# Patient Record
Sex: Male | Born: 1945 | Race: White | Hispanic: No | Marital: Married | Smoking: Former smoker
Health system: Southern US, Community
[De-identification: ages and names within clinical notes are randomized; demographics above are authoritative.]

## PROBLEM LIST (undated history)

## (undated) DIAGNOSIS — E669 Obesity, unspecified: Secondary | ICD-10-CM

## (undated) DIAGNOSIS — M109 Gout, unspecified: Secondary | ICD-10-CM

## (undated) DIAGNOSIS — IMO0002 Reserved for concepts with insufficient information to code with codable children: Secondary | ICD-10-CM

## (undated) DIAGNOSIS — E7800 Pure hypercholesterolemia, unspecified: Secondary | ICD-10-CM

## (undated) DIAGNOSIS — I259 Chronic ischemic heart disease, unspecified: Secondary | ICD-10-CM

## (undated) DIAGNOSIS — I10 Essential (primary) hypertension: Secondary | ICD-10-CM

## (undated) HISTORY — DX: Gout, unspecified: M10.9

## (undated) HISTORY — DX: Essential (primary) hypertension: I10

## (undated) HISTORY — DX: Reserved for concepts with insufficient information to code with codable children: IMO0002

## (undated) HISTORY — DX: Pure hypercholesterolemia, unspecified: E78.00

## (undated) HISTORY — DX: Obesity, unspecified: E66.9

## (undated) HISTORY — PX: SHOULDER SURGERY: SHX246

## (undated) HISTORY — DX: Chronic ischemic heart disease, unspecified: I25.9

## (undated) HISTORY — PX: CARDIAC CATHETERIZATION: SHX172

---

## 1980-09-20 HISTORY — PX: KNEE ARTHROSCOPY: SUR90

## 1997-09-20 DIAGNOSIS — I259 Chronic ischemic heart disease, unspecified: Secondary | ICD-10-CM

## 1997-09-20 HISTORY — DX: Chronic ischemic heart disease, unspecified: I25.9

## 1998-05-02 ENCOUNTER — Ambulatory Visit (HOSPITAL_COMMUNITY): Admission: RE | Admit: 1998-05-02 | Discharge: 1998-05-03 | Payer: Self-pay | Admitting: Cardiology

## 1998-08-27 ENCOUNTER — Ambulatory Visit (HOSPITAL_COMMUNITY): Admission: RE | Admit: 1998-08-27 | Discharge: 1998-08-27 | Payer: Self-pay | Admitting: Cardiology

## 2005-08-13 ENCOUNTER — Encounter: Admission: RE | Admit: 2005-08-13 | Discharge: 2005-08-13 | Payer: Self-pay | Admitting: Neurosurgery

## 2005-08-15 ENCOUNTER — Encounter: Admission: RE | Admit: 2005-08-15 | Discharge: 2005-08-15 | Payer: Self-pay | Admitting: Neurosurgery

## 2006-02-22 ENCOUNTER — Inpatient Hospital Stay (HOSPITAL_BASED_OUTPATIENT_CLINIC_OR_DEPARTMENT_OTHER): Admission: RE | Admit: 2006-02-22 | Discharge: 2006-02-22 | Payer: Self-pay | Admitting: Cardiology

## 2007-01-06 ENCOUNTER — Encounter: Admission: RE | Admit: 2007-01-06 | Discharge: 2007-01-06 | Payer: Self-pay | Admitting: Orthopaedic Surgery

## 2009-07-02 ENCOUNTER — Encounter: Admission: RE | Admit: 2009-07-02 | Discharge: 2009-07-02 | Payer: Self-pay | Admitting: Orthopedic Surgery

## 2010-07-24 ENCOUNTER — Ambulatory Visit: Payer: Self-pay | Admitting: Cardiology

## 2010-07-24 ENCOUNTER — Encounter: Admission: RE | Admit: 2010-07-24 | Discharge: 2010-07-24 | Payer: Self-pay | Admitting: Cardiology

## 2010-07-28 ENCOUNTER — Ambulatory Visit (HOSPITAL_COMMUNITY): Admission: RE | Admit: 2010-07-28 | Discharge: 2010-07-28 | Payer: Self-pay | Admitting: Cardiology

## 2010-07-28 ENCOUNTER — Ambulatory Visit: Payer: Self-pay | Admitting: Cardiology

## 2010-08-09 ENCOUNTER — Ambulatory Visit: Payer: Self-pay | Admitting: Internal Medicine

## 2010-08-09 ENCOUNTER — Inpatient Hospital Stay (HOSPITAL_COMMUNITY)
Admission: EM | Admit: 2010-08-09 | Discharge: 2010-08-14 | Payer: Self-pay | Source: Home / Self Care | Admitting: Emergency Medicine

## 2010-08-09 ENCOUNTER — Ambulatory Visit: Payer: Self-pay | Admitting: Cardiology

## 2010-08-10 ENCOUNTER — Encounter: Payer: Self-pay | Admitting: Thoracic Surgery (Cardiothoracic Vascular Surgery)

## 2010-08-10 ENCOUNTER — Ambulatory Visit: Payer: Self-pay | Admitting: Thoracic Surgery (Cardiothoracic Vascular Surgery)

## 2010-08-11 HISTORY — PX: CORONARY ARTERY BYPASS GRAFT: SHX141

## 2010-08-28 ENCOUNTER — Ambulatory Visit: Payer: Self-pay | Admitting: Cardiovascular Disease

## 2010-08-31 ENCOUNTER — Ambulatory Visit: Payer: Self-pay | Admitting: Thoracic Surgery (Cardiothoracic Vascular Surgery)

## 2010-09-07 ENCOUNTER — Ambulatory Visit: Payer: Self-pay | Admitting: Thoracic Surgery (Cardiothoracic Vascular Surgery)

## 2010-09-07 ENCOUNTER — Encounter
Admission: RE | Admit: 2010-09-07 | Discharge: 2010-09-07 | Payer: Self-pay | Source: Home / Self Care | Attending: Thoracic Surgery (Cardiothoracic Vascular Surgery) | Admitting: Thoracic Surgery (Cardiothoracic Vascular Surgery)

## 2010-09-22 ENCOUNTER — Ambulatory Visit
Admission: RE | Admit: 2010-09-22 | Discharge: 2010-09-22 | Payer: Self-pay | Source: Home / Self Care | Attending: Thoracic Surgery (Cardiothoracic Vascular Surgery) | Admitting: Thoracic Surgery (Cardiothoracic Vascular Surgery)

## 2010-09-29 ENCOUNTER — Ambulatory Visit: Payer: Self-pay | Admitting: Cardiology

## 2010-10-15 NOTE — Discharge Summary (Addendum)
NAMEJONTAVIUS, Glass NO.:  [id number]  MEDICAL RECORD NO.:  [id number]          PATIENT TYPE:  INP  LOCATION:  2018                         FACILITY:  [facility]  PHYSICIAN:  Salvatore Decent. Dorris Fetch, M.D.DATE OF BIRTH:  05/12/1946  DATE OF ADMISSION:  08/09/2010 DATE OF DISCHARGE:  08/14/2010                              DISCHARGE SUMMARY   FINAL DIAGNOSIS:  Severe three-vessel coronary artery disease with unstable angina.  IN-HOSPITAL DIAGNOSIS:  Acute blood loss anemia postoperatively.  SECONDARY DIAGNOSES: 1. History of coronary artery disease status post percutaneous     transluminal coronary angioplasty and stenting of left anterior     descending coronary artery, diagonal, and right coronary artery in     1999. 2. Hypertension. 3. Dyslipidemia. 4. Arthritis. 5. Hypothyroidism. 6. History of gastroesophageal reflux disease.  IN-HOSPITAL OPERATIONS AND PROCEDURES:  Coronary artery bypass graftingx4 using a left internal mammary artery to left anterior descending, saphenous vein graft to first diagonal, saphenous vein graft to obtuse marginal one, saphenous vein graft to posterior descending.  Endoscopic vein harvest from right leg was used.  PATIENT'S HISTORY AND PHYSICAL AND HOSPITAL COURSE:  Mr. Michael Glass is a 65- year-old gentleman with a history of coronary artery disease dating back to 1999 when he had multiple stents placed.  He states over the past year, he has been having increasing fatigue and decreased exercise tolerance.  Patient began having chest pain several months ago and on July 28, 2010, underwent cardiac catheterization which showed disease in the distal left main and proximal LAD as well as diffuse disease in the right coronary.  He also had diffuse 50% narrowing in the left circumflex.  The patient had normal left ventricular function.  An attempt was made to manage the patient with medical therapy, however, he began having increasing  severity and frequency of chest pain.  On August 09, 2010, he had an episode while he was watching television of severe left-sided chest pain and radiation in left arm associated with diaphoresis and shortness of breath.  Initial cardiac enzymes were negative and he did rule out for myocardial infarction.  Following admission, Dr. Dorris Fetch was consulted for evaluation for possible coronary artery bypass grafting.  Dr. Dorris Fetch discussed with patient undergoing coronary bypass grafting.  Discussed risks and benefits with patient.  The patient acknowledged understanding and agreed to proceed. Surgery was scheduled for August 11, 2010.  Preoperatively, patient had bilateral carotid duplex ultrasound showing no significant ICA stenosis.  He also had preoperative ABIs which were within normal limits.  For further details of patient's past medical history and physical exam, please see dictated H and P.  The patient was taken to the operating room on August 11, 2010, where he underwent coronary artery bypass grafting x4 using  left internal mammary artery to left anterior descending, saphenous vein graft to first diagonal, saphenous vein graft to obtuse marginal one, saphenous vein graft to posterior descending.  Endoscopic vein harvest from right leg was done.  The patient tolerated this procedure well and was transferred to the intensive care unit in stable condition. Postoperatively, patient was noted to  be hemodynamically stable.  He was extubating of surgery.  Post extubation, the patient was noted to be alert and oriented x4.  Neuro intact.  Postop day #1, patient was noted to be in normal sinus rhythm.  Blood pressure was well controlled.  All drips were able to be weaned and discontinued.  He was restarted on his atenolol.  Chest x-ray obtained postop day #1 was stable.  There is minimal drainage from chest tubes and chest tubes were discontinued in routine fashion.  The  patient was up ambulating with assistance.  He is felt to be stable and transferred out to PCTU postop day #1.  On telemetry floor, the patient continued to progress well.  Followup chest x-ray postop day #2, remained stable with small pleural effusions and mild atelectasis noted.  Blood pressures noted to be slightly elevated and atenolol was adjusted.  The patient was maintained in normal sinus rhythm.  With adjustment of atenolol, his blood pressure has improved. At this time, blood pressure is well controlled and patient was not been started on an ACE inhibitor.  This can be reevaluated as an outpatient as needed.  Patient had been encouraged to use his incentive spirometer. He has been able to be weaned off oxygen with O2 saturations maintaining greater than 90% on room air.  The patient did have some mild acute blood loss anemia and hemoglobin/hematocrit was followed.  This did remain stable.  The patient did not require any blood transfusions postoperatively.  Patient continued to ambulate well.  He was up ambulating on his own.  He was tolerating diet well.  No nausea, vomiting noted.  All incisions were clean, dry, and intact and healing well.  On August 14, 2010, postop day #3, the patient was noted to be afebrile in normal sinus rhythm.  Blood pressure well controlled with O2 sats greater than 90% on room air.  Chest x-ray showed persistent small bilateral pleural effusions.  His most recent lab work shows white blood cell count of 11.6, hemoglobin 10.2, hematocrit 31.0, platelet count 216.  Sodium of 133, potassium 4.5, chloride of 101, bicarbonate 20, BUN 28, creatinine 1.34, glucose 115.  The patient is felt to be stable and ready for discharge to home today postop day #3.  He was seen and evaluated by Dr. Donata Clay on morning rounds.  FOLLOWUP APPOINTMENTS:  A followup appointment will be arranged with Dr. Dorris Fetch in 3 weeks.  Our office will contact patient with  this information.  The patient will need to obtain PMI chest x-ray, 30 minutes prior to this appointment.  He will need to follow up with Dr. Deborah Chalk in 2 weeks.  He will need to contact the office to make these arrangements.  ACTIVITY:  Patient instructed no driving until released to do so, no heavy lifting over 10 pounds.  He is told to ambulate 3-4 times per day, progress as tolerated, and continue his breathing exercises.  INCISIONAL CARE:  The patient is told to shower, washing his incisions using soap and water.  He is to contact the office if he develops any drainage opening from any of his incision sites.  DIET:  The patient was educated on diet, to be low-fat and low-salt.  DISCHARGE MEDICATIONS: 1. Oxycodone 5 mg 1-1 tabs q.3 hours p.r.n. pain. 2. Aspirin 325 mg daily. 3. Atenolol 50 mg daily. 4. Artificial tears one drop both eyes p.r.n. 5. Crestor 5 mg daily. 6. Levothyroxine 80 mcg daily. 7. Multivitamin daily. 8. Nexium  40 mg p.r.n. 9. TriCor 145 mg daily. 10.Ambien 10 mg at night.     Sol Blazing, PA   ______________________________ Salvatore Decent Dorris Fetch, M.D.    KMD/MEDQ  D:  08/14/2010  T:  08/14/2010  Job:  [id number]  cc:   Salvatore Decent. Dorris Fetch, M.D. Colleen Can. Deborah Chalk, M.D. Electronically Signed by Cameron Proud PA on 09/29/2010 12:35:51 PM Electronically Signed by Charlett Lango M.D. on 10/15/2010 11:28:15 AM

## 2010-11-23 ENCOUNTER — Ambulatory Visit (INDEPENDENT_AMBULATORY_CARE_PROVIDER_SITE_OTHER): Payer: Managed Care, Other (non HMO) | Admitting: Nurse Practitioner

## 2010-11-23 DIAGNOSIS — I2510 Atherosclerotic heart disease of native coronary artery without angina pectoris: Secondary | ICD-10-CM

## 2010-11-23 DIAGNOSIS — I10 Essential (primary) hypertension: Secondary | ICD-10-CM

## 2010-12-01 LAB — PROTIME-INR
INR: 1 (ref 0.00–1.49)
INR: 1.44 (ref 0.00–1.49)
Prothrombin Time: 13.4 seconds (ref 11.6–15.2)
Prothrombin Time: 17.7 seconds — ABNORMAL HIGH (ref 11.6–15.2)

## 2010-12-01 LAB — LIPID PANEL
Cholesterol: 147 mg/dL (ref 0–200)
HDL: 37 mg/dL — ABNORMAL LOW (ref >39)
LDL Cholesterol: 92 mg/dL (ref 0–99)
Total CHOL/HDL Ratio: 4 RATIO

## 2010-12-01 LAB — POCT CARDIAC MARKERS
CKMB, poc: <1 ng/mL — ABNORMAL LOW (ref 1.0–8.0)
Myoglobin, poc: 54.2 ng/mL (ref 12–200)
Troponin i, poc: <0.05 ng/mL (ref 0.00–0.09)

## 2010-12-01 LAB — BASIC METABOLIC PANEL
BUN: 13 mg/dL (ref 6–23)
BUN: 28 mg/dL — ABNORMAL HIGH (ref 6–23)
CO2: 22 mEq/L (ref 19–32)
CO2: 25 mEq/L (ref 19–32)
Calcium: 9.6 mg/dL (ref 8.4–10.5)
Chloride: 101 mEq/L (ref 96–112)
Chloride: 104 mEq/L (ref 96–112)
Creatinine, Ser: 1.31 mg/dL (ref 0.4–1.5)
Creatinine, Ser: 1.34 mg/dL (ref 0.4–1.5)
GFR calc Af Amer: >60 mL/min (ref >60)
Glucose, Bld: 108 mg/dL — ABNORMAL HIGH (ref 70–99)
Glucose, Bld: 130 mg/dL — ABNORMAL HIGH (ref 70–99)
Potassium: 4.3 mEq/L (ref 3.5–5.1)
Sodium: 135 mEq/L (ref 135–145)

## 2010-12-01 LAB — COMPREHENSIVE METABOLIC PANEL
ALT: 25 U/L (ref 0–53)
AST: 29 U/L (ref 0–37)
Albumin: 4.1 g/dL (ref 3.5–5.2)
Alkaline Phosphatase: 45 U/L (ref 39–117)
BUN: 13 mg/dL (ref 6–23)
CO2: 25 mEq/L (ref 19–32)
Calcium: 9.3 mg/dL (ref 8.4–10.5)
Chloride: 103 mEq/L (ref 96–112)
Creatinine, Ser: 1.4 mg/dL (ref 0.4–1.5)
GFR calc Af Amer: >60 mL/min (ref >60)
GFR calc non Af Amer: 51 mL/min — ABNORMAL LOW (ref >60)
Glucose, Bld: 87 mg/dL (ref 70–99)
Potassium: 4.2 mEq/L (ref 3.5–5.1)
Sodium: 136 mEq/L (ref 135–145)
Total Bilirubin: 0.5 mg/dL (ref 0.3–1.2)
Total Protein: 7 g/dL (ref 6.0–8.3)

## 2010-12-01 LAB — CARDIAC PANEL(CRET KIN+CKTOT+MB+TROPI)
CK, MB: 1.7 ng/mL (ref 0.3–4.0)
Relative Index: 1.2 (ref 0.0–2.5)
Relative Index: 1.3 (ref 0.0–2.5)
Total CK: 105 U/L (ref 7–232)
Total CK: 130 U/L (ref 7–232)
Troponin I: <0.01 ng/mL (ref 0.00–0.06)

## 2010-12-01 LAB — CBC
HCT: 30.5 % — ABNORMAL LOW (ref 39.0–52.0)
HCT: 37 % — ABNORMAL LOW (ref 39.0–52.0)
HCT: 37.4 % — ABNORMAL LOW (ref 39.0–52.0)
Hemoglobin: 10 g/dL — ABNORMAL LOW (ref 13.0–17.0)
Hemoglobin: 11 g/dL — ABNORMAL LOW (ref 13.0–17.0)
Hemoglobin: 12.1 g/dL — ABNORMAL LOW (ref 13.0–17.0)
Hemoglobin: 12.4 g/dL — ABNORMAL LOW (ref 13.0–17.0)
MCH: 27.5 pg (ref 26.0–34.0)
MCH: 27.6 pg (ref 26.0–34.0)
MCH: 27.6 pg (ref 26.0–34.0)
MCH: 28 pg (ref 26.0–34.0)
MCH: 28.4 pg (ref 26.0–34.0)
MCHC: 32.4 g/dL (ref 30.0–36.0)
MCHC: 32.8 g/dL (ref 30.0–36.0)
MCHC: 32.9 g/dL (ref 30.0–36.0)
MCHC: 33 g/dL (ref 30.0–36.0)
MCHC: 33.2 g/dL (ref 30.0–36.0)
MCHC: 33.5 g/dL (ref 30.0–36.0)
MCV: 84 fL (ref 78.0–100.0)
MCV: 84.7 fL (ref 78.0–100.0)
MCV: 85 fL (ref 78.0–100.0)
MCV: 85.2 fL (ref 78.0–100.0)
MCV: 85.2 fL (ref 78.0–100.0)
Platelets: 198 10*3/uL (ref 150–400)
Platelets: 216 10*3/uL (ref 150–400)
Platelets: 234 10*3/uL (ref 150–400)
Platelets: 270 10*3/uL (ref 150–400)
Platelets: 270 10*3/uL (ref 150–400)
Platelets: 286 10*3/uL (ref 150–400)
RBC: 3.92 MIL/uL — ABNORMAL LOW (ref 4.22–5.81)
RBC: 4.17 MIL/uL — ABNORMAL LOW (ref 4.22–5.81)
RBC: 4.37 MIL/uL (ref 4.22–5.81)
RBC: 4.39 MIL/uL (ref 4.22–5.81)
RDW: 13.6 % (ref 11.5–15.5)
RDW: 13.8 % (ref 11.5–15.5)
RDW: 13.8 % (ref 11.5–15.5)
RDW: 13.9 % (ref 11.5–15.5)
RDW: 14.2 % (ref 11.5–15.5)
WBC: 11.6 10*3/uL — ABNORMAL HIGH (ref 4.0–10.5)
WBC: 12.4 10*3/uL — ABNORMAL HIGH (ref 4.0–10.5)
WBC: 14.5 10*3/uL — ABNORMAL HIGH (ref 4.0–10.5)
WBC: 16.1 10*3/uL — ABNORMAL HIGH (ref 4.0–10.5)
WBC: 6.5 10*3/uL (ref 4.0–10.5)
WBC: 7.1 10*3/uL (ref 4.0–10.5)

## 2010-12-01 LAB — CREATININE, SERUM
Creatinine, Ser: 1.28 mg/dL (ref 0.4–1.5)
GFR calc Af Amer: 49 mL/min — ABNORMAL LOW (ref >60)
GFR calc Af Amer: >60 mL/min (ref >60)
GFR calc non Af Amer: 41 mL/min — ABNORMAL LOW (ref >60)
GFR calc non Af Amer: 57 mL/min — ABNORMAL LOW (ref >60)

## 2010-12-01 LAB — CK TOTAL AND CKMB (NOT AT ARMC)
CK, MB: 1.4 ng/mL (ref 0.3–4.0)
Relative Index: 1.3 (ref 0.0–2.5)
Total CK: 107 U/L (ref 7–232)

## 2010-12-01 LAB — GLUCOSE, CAPILLARY
Glucose-Capillary: 142 mg/dL — ABNORMAL HIGH (ref 70–99)
Glucose-Capillary: 89 mg/dL (ref 70–99)
Glucose-Capillary: 95 mg/dL (ref 70–99)

## 2010-12-01 LAB — BLOOD GAS, ARTERIAL
Acid-Base Excess: 0.9 mmol/L (ref 0.0–2.0)
Drawn by: 33247
O2 Saturation: 97.4 %
Patient temperature: 98
TCO2: 25.7 mmol/L (ref 0–100)
pCO2 arterial: 35.9 mmHg (ref 35.0–45.0)

## 2010-12-01 LAB — HEMOGLOBIN AND HEMATOCRIT, BLOOD: HCT: 27.7 % — ABNORMAL LOW (ref 39.0–52.0)

## 2010-12-01 LAB — TSH: TSH: 6.962 u[IU]/mL — ABNORMAL HIGH (ref 0.350–4.500)

## 2010-12-01 LAB — POCT I-STAT, CHEM 8
BUN: 16 mg/dL (ref 6–23)
Calcium, Ion: 1.15 mmol/L (ref 1.12–1.32)
Chloride: 105 mEq/L (ref 96–112)
Creatinine, Ser: 1.3 mg/dL (ref 0.4–1.5)
Creatinine, Ser: 1.4 mg/dL (ref 0.4–1.5)
Glucose, Bld: 119 mg/dL — ABNORMAL HIGH (ref 70–99)
Glucose, Bld: 165 mg/dL — ABNORMAL HIGH (ref 70–99)
HCT: 40 % (ref 39.0–52.0)
Hemoglobin: 11.9 g/dL — ABNORMAL LOW (ref 13.0–17.0)
Hemoglobin: 13.6 g/dL (ref 13.0–17.0)
Potassium: 3.7 mEq/L (ref 3.5–5.1)
Sodium: 137 mEq/L (ref 135–145)
Sodium: 138 mEq/L (ref 135–145)
TCO2: 19 mmol/L (ref 0–100)
TCO2: 26 mmol/L (ref 0–100)

## 2010-12-01 LAB — HEPARIN LEVEL (UNFRACTIONATED): Heparin Unfractionated: <0.1 IU/mL — ABNORMAL LOW (ref 0.30–0.70)

## 2010-12-01 LAB — POCT I-STAT GLUCOSE
Glucose, Bld: 111 mg/dL — ABNORMAL HIGH (ref 70–99)
Operator id: 286331

## 2010-12-01 LAB — DIFFERENTIAL
Basophils Absolute: 0.1 10*3/uL (ref 0.0–0.1)
Basophils Relative: 1 % (ref 0–1)
Eosinophils Absolute: 0.3 10*3/uL (ref 0.0–0.7)
Eosinophils Relative: 4 % (ref 0–5)
Lymphocytes Relative: 31 % (ref 12–46)
Lymphs Abs: 2.2 10*3/uL (ref 0.7–4.0)
Monocytes Absolute: 0.6 10*3/uL (ref 0.1–1.0)
Monocytes Relative: 9 % (ref 3–12)
Neutro Abs: 3.9 10*3/uL (ref 1.7–7.7)
Neutrophils Relative %: 55 % (ref 43–77)

## 2010-12-01 LAB — POCT I-STAT 3, ART BLOOD GAS (G3+)
Acid-base deficit: 4 mmol/L — ABNORMAL HIGH (ref 0.0–2.0)
Acid-base deficit: 4 mmol/L — ABNORMAL HIGH (ref 0.0–2.0)
Bicarbonate: 21.1 mEq/L (ref 20.0–24.0)
Bicarbonate: 23.9 mEq/L (ref 20.0–24.0)
O2 Saturation: 100 %
O2 Saturation: 99 %
TCO2: 22 mmol/L (ref 0–100)
TCO2: 25 mmol/L (ref 0–100)
pCO2 arterial: 37.7 mmHg (ref 35.0–45.0)
pCO2 arterial: 40.4 mmHg (ref 35.0–45.0)
pCO2 arterial: 47.1 mmHg — ABNORMAL HIGH (ref 35.0–45.0)
pH, Arterial: 7.314 — ABNORMAL LOW (ref 7.350–7.450)
pH, Arterial: 7.328 — ABNORMAL LOW (ref 7.350–7.450)
pO2, Arterial: 131 mmHg — ABNORMAL HIGH (ref 80.0–100.0)
pO2, Arterial: 144 mmHg — ABNORMAL HIGH (ref 80.0–100.0)

## 2010-12-01 LAB — URINALYSIS, MICROSCOPIC ONLY
Hgb urine dipstick: NEGATIVE
Leukocytes, UA: NEGATIVE
Specific Gravity, Urine: 1.015 (ref 1.005–1.030)
Urobilinogen, UA: 0.2 mg/dL (ref 0.0–1.0)

## 2010-12-01 LAB — POCT I-STAT 4, (NA,K, GLUC, HGB,HCT)
Glucose, Bld: 112 mg/dL — ABNORMAL HIGH (ref 70–99)
Glucose, Bld: 145 mg/dL — ABNORMAL HIGH (ref 70–99)
HCT: 27 % — ABNORMAL LOW (ref 39.0–52.0)
Hemoglobin: 10.5 g/dL — ABNORMAL LOW (ref 13.0–17.0)
Hemoglobin: 13.3 g/dL (ref 13.0–17.0)
Hemoglobin: 9.2 g/dL — ABNORMAL LOW (ref 13.0–17.0)
Potassium: 3.8 mEq/L (ref 3.5–5.1)
Potassium: 4.2 mEq/L (ref 3.5–5.1)
Potassium: 4.7 mEq/L (ref 3.5–5.1)
Sodium: 132 mEq/L — ABNORMAL LOW (ref 135–145)
Sodium: 133 mEq/L — ABNORMAL LOW (ref 135–145)
Sodium: 138 mEq/L (ref 135–145)

## 2010-12-01 LAB — MRSA PCR SCREENING: MRSA by PCR: NEGATIVE

## 2010-12-01 LAB — TROPONIN I: Troponin I: 0.03 ng/mL (ref 0.00–0.06)

## 2010-12-01 LAB — TYPE AND SCREEN

## 2010-12-01 LAB — MAGNESIUM
Magnesium: 1.8 mg/dL (ref 1.5–2.5)
Magnesium: 2.4 mg/dL (ref 1.5–2.5)

## 2010-12-01 LAB — APTT: aPTT: 32 seconds (ref 24–37)

## 2010-12-01 LAB — PLATELET COUNT: Platelets: 203 10*3/uL (ref 150–400)

## 2011-01-09 ENCOUNTER — Encounter: Payer: Self-pay | Admitting: Cardiology

## 2011-01-09 DIAGNOSIS — H9190 Unspecified hearing loss, unspecified ear: Secondary | ICD-10-CM | POA: Insufficient documentation

## 2011-01-09 DIAGNOSIS — E669 Obesity, unspecified: Secondary | ICD-10-CM | POA: Insufficient documentation

## 2011-01-09 DIAGNOSIS — IMO0002 Reserved for concepts with insufficient information to code with codable children: Secondary | ICD-10-CM | POA: Insufficient documentation

## 2011-01-09 DIAGNOSIS — E7800 Pure hypercholesterolemia, unspecified: Secondary | ICD-10-CM | POA: Insufficient documentation

## 2011-01-09 DIAGNOSIS — I10 Essential (primary) hypertension: Secondary | ICD-10-CM | POA: Insufficient documentation

## 2011-01-09 DIAGNOSIS — I259 Chronic ischemic heart disease, unspecified: Secondary | ICD-10-CM | POA: Insufficient documentation

## 2011-01-09 DIAGNOSIS — M109 Gout, unspecified: Secondary | ICD-10-CM | POA: Insufficient documentation

## 2011-01-18 ENCOUNTER — Encounter (INDEPENDENT_AMBULATORY_CARE_PROVIDER_SITE_OTHER): Payer: Managed Care, Other (non HMO)

## 2011-01-18 ENCOUNTER — Ambulatory Visit (INDEPENDENT_AMBULATORY_CARE_PROVIDER_SITE_OTHER): Payer: Managed Care, Other (non HMO) | Admitting: Cardiology

## 2011-01-18 ENCOUNTER — Encounter: Payer: Self-pay | Admitting: Cardiology

## 2011-01-18 DIAGNOSIS — I259 Chronic ischemic heart disease, unspecified: Secondary | ICD-10-CM

## 2011-01-18 DIAGNOSIS — IMO0002 Reserved for concepts with insufficient information to code with codable children: Secondary | ICD-10-CM

## 2011-01-18 DIAGNOSIS — I10 Essential (primary) hypertension: Secondary | ICD-10-CM

## 2011-01-18 NOTE — Progress Notes (Signed)
Subjective:   Michael Glass comes in today for followup visit. He had coronary artery bypass grafting x4 and August 11, 2010 by Dr. Dorris Fetch. He had a LIMA to the LAD, saphenous vein graft to the first diagonal, saphenous vein graft to the obtuse marginal one, and a saphenous vein graft to posterior descending artery. He has some residual collection of fluid or a mass at the vein harvesting site on his right lower thigh. He also has considerable paresthesias related to his harvesting of his left internal mammary artery. Otherwise, he has no other significant cardiovascular complaints and is able to exercise much better than he could before.  He has a history of hypercholesterolemia, obesity, and hypertension with good outpatient control.  Current Outpatient Prescriptions  Medication Sig Dispense Refill  . aspirin 81 MG tablet Take 81 mg by mouth 4 (four) times daily.       Marland Kitchen atenolol (TENORMIN) 25 MG tablet Take 25 mg by mouth daily.        . fenofibrate (TRICOR) 145 MG tablet Take 145 mg by mouth daily.        . Multiple Vitamins-Minerals (MEGA MULTI MEN PO) Take by mouth daily.        . nitroGLYCERIN (NITROSTAT) 0.4 MG SL tablet Place 0.4 mg under the tongue every 5 (five) minutes as needed.        Marland Kitchen omeprazole (PRILOSEC) 20 MG capsule Take 20 mg by mouth as needed.        . rosuvastatin (CRESTOR) 5 MG tablet Take 5 mg by mouth 2 (two) times a week.        Marland Kitchen ZOLPIDEM TARTRATE PO Take by mouth at bedtime.          Allergies  Allergen Reactions  . Sulfa Drugs Cross Reactors     Patient Active Problem List  Diagnoses  . Hypertension  . Seroma  . Hearing loss  . Ischemic heart disease  . Hypercholesterolemia  . Gouty arthritis  . Obesity    History  Smoking status  . Former Smoker  . Quit date: 09/21/1999  Smokeless tobacco  . Not on file    History  Alcohol Use No    Family History  Problem Relation Age of Onset  . Hypertension Mother   . Parkinsonism Father   . Angina  Sister     Review of Systems:  He is having recurrent prostatitis. He has persistent soreness in his chest. He is not having any chest pain, shortness of breath, dizziness, headache, but he does complain of generalized arthritis. The patient denies any heat or cold intolerance.  No weight gain or weight loss.  The patient denies headaches or blurry vision.  There is no cough or sputum production.  The patient denies dizziness.  There is no hematuria or hematochezia.  The patient denies any muscle aches or arthritis.  The patient denies any rash.  The patient denies frequent falling or instability.  There is no history of depression or anxiety.  All other systems were reviewed and are negative.   Physical Exam:   Weight is 2:30. Blood pressure is 118/78 sitting, heart rate is 64  The head is normocephalic and atraumatic.  Pupils are equally round and reactive to light.  Sclerae nonicteric.  Conjunctiva is clear.  Oropharynx is unremarkable.  There's adequate oral airway.  Neck is supple there are no masses.  Thyroid is not enlarged.  There is no lymphadenopathy.  Lungs are clear.  Chest is symmetric.  Heart  shows a regular rate and rhythm.  S1 and S2 are normal.  There is no murmur click or gallop.  Abdomen is soft normal bowel sounds.  There is no organomegaly.  Genital and rectal deferred.  Extremities are without edema. There is a golf ball size mass on his right medial thigh just above the level of the knee. Peripheral pulses are adequate.  Neurologically intact.  Full range of motion.  The patient is not depressed.  Skin is warm and dry.chest surgical wounds have healed nicely but he does have paresthesias left of the sternum. Assessment / Plan:

## 2011-01-19 NOTE — Assessment & Plan Note (Signed)
I'll ask him to see Dr. Dorris Fetch for review at this persistent seroma in his right leg. I tried to be reassuring regarding paresthesias in his left chest.

## 2011-01-19 NOTE — Assessment & Plan Note (Signed)
OFFICE VISIT  Michael Glass, Michael Glass DOB:  03/10/1946                                        January 18, 2011 CHART #:  [id number]  The patient comes in today for check of his right lower extremity EVH site.  He is status post coronary artery bypass grafting in November 2011.  Following his surgery, he did return to the office on 2 occasions for check of his [facility] site, which have a small seroma.  On the first visit, the area was aspirated and about an 18-20 mL of serous fluid was removed.  He returned for follow-up about a week later and another attempt was made to aspirate the area yielding only 2-3 mL of old dark blood.  He has been followed up in January by Dr. Dorris Fetch and at that time the area had decreased in size and was not giving any trouble. He was then released from our care.  He returned this week for recheck with Dr. Deborah Chalk and the area was noted still be present on physical exam.  Dr. Deborah Chalk refer the patient back to our office for reevaluation.  He denies any soreness or tenderness in the area and has had no erythema or drainage.  He states that the area occasionally looks slightly enlarged, but mostly stable since his last visit here in January.  He otherwise has done well status post CABG.  He has return to his normal activities and is progressing well.  PHYSICAL EXAMINATION:  VITAL SIGNS:  Blood pressure is 127/74, pulse is 76, respirations 20, O2 sat 97% on room air.  His sternal incision has healed well.  Heart has a. Regular rate and rhythm without murmurs, rubs or gallops.  Lungs are clear to auscultation.  Lower extremities show no significant edema.  He has approximately 3 x 3 cm area medial to his right lower extremity EVH site, which is firm to palpation and no fluctuance is noted.  There is no erythema or drainage.  The area is nontender.  ASSESSMENT/PLAN:  The patient appears to have a stable area of either old seroma or scar tissue  at the endoscopic vein harvest site.  Dr. Cornelius Moras also evaluated the area and since he is not symptomatic and the area is remaining stable and not enlarging he will not need any further intervention at this point.  He is asked to call our office if he has any pain in the area, the area enlarges or he develops any redness or swelling.  Michael Glass, P.A.  GC/MEDQ  D:  01/18/2011  T:  01/19/2011  Job:  [id number]  cc:   Colleen Can. Deborah Chalk, M.D.

## 2011-01-19 NOTE — Assessment & Plan Note (Signed)
Blood pressure readings are satisfactory

## 2011-02-02 NOTE — Assessment & Plan Note (Signed)
OFFICE VISIT   Michael Glass, Michael Glass  DOB:  09-Jan-1946                                        September 07, 2010  CHART #:  [id number]   HISTORY:  The patient comes in today for 3-week postoperative followup.  He is status post coronary artery bypass grafting x4 on August 11, 2010.  His postoperative course was uneventful and he was able to be  discharged home in good condition on postop day #3.  He was seen back in  the office 1 week ago with swelling of EVH site in the right leg.  He  was found to have a seroma of the right EVH site, out of which  approximately 18 mL of serous fluid was drained.  He reports that since  that time, the swelling has recurred and he is having some tenderness  over the area.  He denies any erythema, drainage, fevers, or chills.  He  saw Dr. Deborah Chalk last week in followup and no changes were made to his  medication regimen.  He reports having had 1 episode since his discharge  home of what sounds like orthostatic hypotension where he became  somewhat lightheaded after rising from a seated position.  He has had no  further episodes since that time and his blood pressures were stable  when he was seen at the cardiologist's office.  Otherwise, he is  increasing his activity daily and his stamina is improving.  His  appetite has been good and he reports very little chest discomfort and  no shortness of breath.   PHYSICAL EXAMINATION:  Vital Signs:  Blood pressure is 110/70, pulse is  62 and regular, respirations 16 and unlabored, O2 sat 96% on room air,  and temperature 97.4.  Chest:  His sternal incision has healed well and  the sternum is stable to palpation.  Heart:  Regular rate and rhythm  without murmurs, rubs, or gallops.  Lungs:  Clear to auscultation.  Extremities:  The right lower extremity EVH site is without erythema or  drainage.  There is some recurrent swelling circumferential to the [facility]  site with mild tenderness to  palpation, but no signs of infection.  The  area was anesthetized with topical spray and using an 18-gauge needle, I  attempted to aspirate the area.  However, only 2-3 mL of old dark blood  was evacuated.  There was some improvement in the swelling following the  aspiration and the patient reported symptomatic improvement.   Chest x-ray today shows almost completely resolved pleural effusions and  otherwise normal postoperative changes.   ASSESSMENT/PLAN:  The patient is overall doing well status post coronary  artery bypass graft.  At this point, he may resume driving and increase  his activity as tolerated.  I would like for him to return in 2 weeks  for recheck of the right [facility] site.  He is encouraged to call if he  develops worsening swelling, pain, drainage, erythema, or signs of  infection from the area.   Coral Ceo, P.A.   GC/MEDQ  D:  09/07/2010  T:  09/08/2010  Job:  [id number]   cc:   Colleen Can. Deborah Chalk, M.D.

## 2011-02-02 NOTE — Assessment & Plan Note (Signed)
OFFICE VISIT   Michael Glass, MEALS  DOB:  [birth year]                                        August 31, 2010  CHART #:  [id number]   HISTORY:  The patient is a 65 year old gentleman who is status post  coronary artery bypass grafting x4 on August 11, 2010, by Dr. Charlett Lango.  He called the office on today's date to report that he was  having significant swelling in the right leg at the endoscopic vein  harvest site incision.  We asked him to come in to get this evaluated.  Currently, he reports in general, he is feeling fairly well.  He does  have some moderate soreness in the chest associated with the incision  and sternotomy.  He denies shortness of breath.  He has had no fevers,  chills, or other constitutional symptoms.  He reports that the swelling  has been present for a while, although it has worsened recently.   PHYSICAL EXAMINATION:  Vital Signs:  Blood pressure 117/75, pulse 62,  respiration 16, oxygen saturation is 100% on room air, and temperature  is 97.3.  General Appearance:  This is a well-developed adult male, in  no acute distress.  Pulmonary:  Clear breath sounds bilaterally.  Cardiac:  Regular rate and rhythm.  Normal S1 and S2.  Incisions are  healing well.  Extremities:  The right lower extremity endoscopic vein  harvest site does reveal some significant swelling consistent with  either hematoma or seroma.   BRIEF PROCEDURE:  After prepping the skin with Betadine, I used a 21-  gauge needle to draw off 18 mL of serous fluid.  This showed initially  significant improvement in the swelling in the area.  It offered some  symptomatic relief as well.   ASSESSMENT:  Right-sided endoscopic vein harvest site seroma status post  drainage.  The patient is scheduled to be seen back in the office in 1  week and we will keep this appointment.  We did apply pressure dressing  and the patient understands that he should contact our office  should he  have any further difficulty relating to this issue specifically if the  swelling returns as previously.  We also discussed management of the  pressure dressing that if he is to have  significant lower extremity swelling or any type of numbness, he is to  remove this dressing and just keep it off.   Rowe Clack, P.A.-C.   Sherryll Burger  D:  08/31/2010  T:  09/01/2010  Job:  [id number]   cc:   Colleen Can. Deborah Chalk, M.D.

## 2011-02-02 NOTE — Assessment & Plan Note (Signed)
OFFICE VISIT   Michael Glass, Michael Glass  DOB:  [birth year]                                        September 22, 2010  CHART #:  [id number]   HISTORY:  The patient is a 65 year old gentleman who presented back in  November with unstable angina.  He had been catheterized earlier in a  month and had moderate left main and significant three-vessel disease,  was initially treated medically but failed medical management.  Given  his unstable symptomatology, coronary bypass grafting was recommended  and he underwent coronary bypass grafting x4 on August 11, 2010.  His  postoperative course was uncomplicated and he was discharged home on  postoperative day #3.  He subsequently was seen back in the office with  swelling around the lower portion of the saphenous venectomy site on the  right leg.  He was diagnosed having seroma and about 20 mL of fluid was  evacuated.  He was then seen back couple weeks ago.  On reaspiration  only had 2-3 mm of fluid present at that time.  He states that since  then it has continued to decrease in size.  He is having minimal  incisional discomfort.  He is still taking a pain pill at night before  he goes to bed but other than that he has not having to take anything.  He has not had any anginal-type symptoms since his surgery.  His only  complaint is that he says when he first stands up or when he first  starts to walk he gets a little dizzy.  If he stands still for about 10-  15 seconds, it will resolve and then he can go ahead and walk without  any difficulties after that.   CURRENT MEDICATIONS:  1. Aspirin 325 mg daily.  2. Atenolol 50 mg daily.  3. Crestor 5 mg daily.  4. Levothyroxine 88 mcg daily.  5. Nexium 40 mg daily.  6. TriCor 145 mg daily.  7. Ambien 10 mg at bedtime.  8. multivitamin one daily.  9. Oxycodone 5 mg p.r.n. pain.   ALLERGIES:  He has no known allergies.   PHYSICAL EXAMINATION:  The patient is a well-appearing  65 year old  gentleman in no acute distress.  His blood pressure is 103/67, pulse 62,  respirations are 16, oxygen saturation is 98% on room air.  His lungs  are clear with equal breath sounds bilaterally.  Cardiac exam has a  regular rate and rhythm.  Normal S1 and S2.  No rubs, murmurs, or  gallops.  His sternum is stable.  Sternal incision is clean, dry, and  intact.  His leg incision has healed well.  There is a small seroma that  is nontender.  There is no erythema or induration.  There is no  peripheral edema.   IMAGING:  Chest x-ray was done back in December which showed no  significant pleural effusions.   IMPRESSION:  The patient is a 65 year old gentleman.  He is now about 6  weeks out from coronary bypass grafting.  He is recovering very well.  His only issue is this dizziness that he gets when he first stands up,  first starts to walk.  His blood pressure and heart rate are both  relatively low and I recommended that he decrease his atenolol to 25 mg  daily, that  was the dose he had been on previously.  I think that may  help his symptoms.  If they persist beyond that, we can reevaluate.  He  otherwise is doing well.  He may begin driving.  He is not to lift any  objects that weigh greater than 20 pounds for another 2 weeks.  Beyond  that, his activities are unrestricted.  He will continue to be followed  by Dr. Humberto Seals at the [facility] as well as Dr. Sherral Hammers in [place]  and Dr. Deborah Chalk here in [place].  I would be happy to see him back  anytime if I can be of any further assistance with his care.   Salvatore Decent Dorris Fetch, M.D.  Electronically Signed   SCH/MEDQ  D:  09/22/2010  T:  09/23/2010  Job:  [id number]   cc:   Colleen Can. Deborah Chalk, M.D.  Humberto Seals, MD  Keturah Barre, MD

## 2011-02-05 NOTE — Cardiovascular Report (Signed)
NAMEKATHAN, Michael Glass NO.:  [id number]   MEDICAL RECORD NO.:  [id number]          PATIENT TYPE:  OIB   LOCATION:  1961                         FACILITY:  [facility]   PHYSICIAN:  Colleen Can. Deborah Chalk, M.D.DATE OF BIRTH:  [birth year]   DATE OF PROCEDURE:  02/22/2006  DATE OF DISCHARGE:                              CARDIAC CATHETERIZATION   PROCEDURE:  Left heart catheterization, selective coronary angiography, left  ventricular angiography.   TYPE AND SITE OF ENTRY:  Percutaneous right femoral artery.   CATHETERS:  4-French 4 curved Judkins right and left coronary catheters, 4-  French pigtail ventriculographic catheter.   CONTRAST MATERIAL:  Omnipaque.   MEDICATIONS GIVEN PRIOR TO THE PROCEDURE:  Valium 10 mg.   MEDICATIONS GIVEN DURING THE PROCEDURE:  Versed 3 mg IV.   COMMENT:  The patient tolerated the procedure well.   HEMODYNAMIC DATA:  The aortic pressure is 120/73, LV is 115 over 7-14.  There is no aortic valve gradient on pullback.   ANGIOGRAPHIC DATA:  1.  The left main coronary artery is normal.  2.  The left circumflex has a high obtuse marginal vessel that is      essentially normal with minor irregularities.  The left circumflex      continues as an obtuse marginal that bifurcates.  In the larger branch      extending to the posterior lateral wall, there is a 70+% stenosis.  It      is a relatively small vessel that would be approximately to 2.5 mm in      diameter.  3.  The left anterior descending is a moderate size vessel that ends on the      anterior wall.  There are stents in the bifurcation point of the largest      diagonal in the continuation of the left anterior descending.  The      stents are widely patent.  In the distal left anterior descending, there      is 50% stenosis approximately 3/4 the way down the vessel.  There is      some haziness at the bifurcation point of the stents, but overall, was      felt to be satisfactory with  only moderate narrowing.  4.  The right coronary artery is a very large dominant vessel with a large      posterior lateral and posterior descending vessels.  There are several      30% stenoses scattered throughout the vessel, but no critical stenosis      is present.  5.  The left ventricular angiogram was performed in the RAO position.      Overall cardiac size and silhouette are normal.  Global ejection      fraction 60%.  Regional wall motion is normal.   OVERALL IMPRESSION:  1.  Normal left ventricular function.  2.  Somewhat diffuse three-vessel coronary disease with 30% stenosis      scattered in the right coronary artery; patent stents in the left      anterior descending with a 30-50% stenosis prior  to the stents and a 50%      stenosis in the distal left anterior descending; 70% stenosis in a third      obtuse marginal branch of the left circumflex at the posterior lateral      wall.   DISCUSSION:  It is felt that the best course of management is a medical  regimen at this point time.  The overall prognosis should be good.  The only  vessel of concern is his posterior lateral branch of the left circumflex  (third obtuse marginal) but it is felt to be a patent enough that we should  be able have success with medical management.      Colleen Can. Deborah Chalk, M.D.  Electronically Signed     SNT/MEDQ  D:  02/22/2006  T:  02/22/2006  Job:  [id number]

## 2011-02-05 NOTE — H&P (Signed)
NAMEPEREGRINE, NOLT NO.:  [id number]   MEDICAL RECORD NO.:  [id number]           PATIENT TYPE:   LOCATION:                                 FACILITY:   PHYSICIAN:  Colleen Can. Deborah Chalk, M.D.    DATE OF BIRTH:   DATE OF ADMISSION:  02/22/2006  DATE OF DISCHARGE:                                HISTORY & PHYSICAL   CHIEF COMPLAINT:  Left arm pain.   HISTORY OF PRESENT ILLNESS:  Michael Glass is a 65 year old white male.  He has a  known history of ischemic heart disease.  Last catheterization dates back to  1999.  He had his last Cardiolite study in November 2006, which was felt to  be satisfactory with no evidence of ischemia.  Ejection fraction at that  time was 68%.  He presents to our office today on February 18, 2006, with  complaints of left arm pain that has been exertional in nature over the past  1-2 weeks.  It occurred originally while he was loading wood.  He was  outside on a very hot day.  He became very diaphoretic yet with no shortness  of breath.  He basically got scared.  He has been noted to have elevation in  blood pressure and has been called in Norvasc 5 mg.  However, he has only  taken it sporadically.  He has continued to try to cut wood and each time  has recurrent left arm pain.  He has taken nitroglycerin with relief and he  now presents for elective cardiac catheterization.   PAST MEDICAL HISTORY:  1.  Known atherosclerotic cerebrovascular disease.  He had catheterization      in August 1999 with stent placement to the LAD, diagonal and right      coronary.  He had follow-up catheterization 4 months later in December      1999, which showed normal LV function, mild three-vessel disease with      persistent patency of the stents in the right coronary, LAD and diagonal      coronary arteries.  2.  Obesity.  3.  Past tobacco abuse.  4.  Hyperlipidemia.  5.  History of elevated TSH.  6.  Remote history of pneumonia.   SURGICAL HISTORY:  1.   Right knee arthroscopy.  2.  Right shoulder surgery.  3.  A T&A.   ALLERGIES:  SULFA.   CURRENT MEDICATIONS:  1.  Tricor 145 mg a day.  2.  Aspirin daily.  3.  Naprosyn p.r.n.  4.  Zetia 10 mg a day.  5.  Norvasc 5 mg a day.  6.  Ambien p.r.n.   FAMILY HISTORY:  Positive for heart disease.  His father has had a history  of angioplasty.  Mother has had a history of hypertension.   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  He is no longer smoking.  He has no alcohol use.  He is married.   REVIEW OF SYSTEMS:  Basically as noted above and is otherwise unremarkable.   PERTINENT  LABORATORY DATA:  Cholesterol levels were checked on May 31.  Unfortunately, he was only 3 hours fasting.  Total cholesterol was 177,  triglycerides 406 and HDL 34, LDL was unable to be calculated.  PT and PTT  are unremarkable.  CBC is normal.  Chemistries are otherwise normal as well.  Chest x-ray is pending.  EKG showed sinus rhythm.   OVERALL IMPRESSION:  1.  Left arm pain, which is his anginal equivalent.  2.  Known ischemic heart disease.  3.  Obesity.  4.  Hyperlipidemia.  5.  Past tobacco abuse.   PROCEDURE:  Will proceed on with diagnostic catheterization.  The procedure  has been discussed in full detail, and he is willing to proceed on Tuesday,  February 22, 2006.      Sharlee Blew, N.P.      Colleen Can. Deborah Chalk, M.D.  Electronically Signed    LC/MEDQ  D:  02/18/2006  T:  02/18/2006  Job:  [id number]

## 2011-06-04 ENCOUNTER — Other Ambulatory Visit: Payer: Self-pay | Admitting: Nurse Practitioner

## 2011-06-04 MED ORDER — ATENOLOL 25 MG PO TABS: 25.0000 mg | ORAL_TABLET | Freq: Every day | ORAL | Status: DC

## 2011-06-04 NOTE — Telephone Encounter (Signed)
Patient wants refill on his altenolol.  He uses [facility] in [place]

## 2011-06-04 NOTE — Telephone Encounter (Signed)
escribe medication per fax request  

## 2011-06-25 ENCOUNTER — Ambulatory Visit (INDEPENDENT_AMBULATORY_CARE_PROVIDER_SITE_OTHER): Payer: Managed Care, Other (non HMO) | Admitting: Nurse Practitioner

## 2011-06-25 ENCOUNTER — Encounter: Payer: Self-pay | Admitting: Nurse Practitioner

## 2011-06-25 DIAGNOSIS — E039 Hypothyroidism, unspecified: Secondary | ICD-10-CM

## 2011-06-25 DIAGNOSIS — E785 Hyperlipidemia, unspecified: Secondary | ICD-10-CM

## 2011-06-25 DIAGNOSIS — E669 Obesity, unspecified: Secondary | ICD-10-CM

## 2011-06-25 DIAGNOSIS — I2510 Atherosclerotic heart disease of native coronary artery without angina pectoris: Secondary | ICD-10-CM

## 2011-06-25 DIAGNOSIS — I10 Essential (primary) hypertension: Secondary | ICD-10-CM

## 2011-06-25 DIAGNOSIS — E7800 Pure hypercholesterolemia, unspecified: Secondary | ICD-10-CM

## 2011-06-25 DIAGNOSIS — I259 Chronic ischemic heart disease, unspecified: Secondary | ICD-10-CM

## 2011-06-25 LAB — BASIC METABOLIC PANEL
BUN: 19 mg/dL (ref 6–23)
CO2: 25 mEq/L (ref 19–32)
Calcium: 9.3 mg/dL (ref 8.4–10.5)
Chloride: 101 mEq/L (ref 96–112)
Creatinine, Ser: 1.4 mg/dL (ref 0.4–1.5)
GFR: 52.57 mL/min — ABNORMAL LOW (ref >60.00)
Glucose, Bld: 103 mg/dL — ABNORMAL HIGH (ref 70–99)
Potassium: 4 mEq/L (ref 3.5–5.1)
Sodium: 136 mEq/L (ref 135–145)

## 2011-06-25 LAB — TSH: TSH: 2.23 u[IU]/mL (ref 0.35–5.50)

## 2011-06-25 LAB — HEPATIC FUNCTION PANEL
ALT: 25 U/L (ref 0–53)
AST: 27 U/L (ref 0–37)
Albumin: 4.8 g/dL (ref 3.5–5.2)
Alkaline Phosphatase: 47 U/L (ref 39–117)
Bilirubin, Direct: 0 mg/dL (ref 0.0–0.3)
Total Bilirubin: 0.8 mg/dL (ref 0.3–1.2)
Total Protein: 8 g/dL (ref 6.0–8.3)

## 2011-06-25 LAB — LIPID PANEL
Cholesterol: 160 mg/dL (ref 0–200)
HDL: 37.9 mg/dL — ABNORMAL LOW (ref >39.00)
LDL Cholesterol: 97 mg/dL (ref 0–99)
Total CHOL/HDL Ratio: 4
Triglycerides: 128 mg/dL (ref 0.0–149.0)
VLDL: 25.6 mg/dL (ref 0.0–40.0)

## 2011-06-25 NOTE — Assessment & Plan Note (Signed)
He is almost one year out from his bypass surgery. He is doing well. Weight is creeping up and I cautioned him about that. He needs to increase his walking and I encouraged him to work towards a goal of 60 minutes per day. We will see him back in 6 months. He will see Dr. Antoine Poche on return. Patient is agreeable to this plan and will call if any problems develop in the interim.

## 2011-06-25 NOTE — Assessment & Plan Note (Signed)
Blood pressure is good at home. No change with his current medicines. Weight loss is encouraged.

## 2011-06-25 NOTE — Patient Instructions (Addendum)
Stay on your current medicines Exercise and stay active. You need to increase your walking. I will have you see Dr. Antoine Poche in 6 months for a follow up visit. We will check labs today.  Call for any problems.

## 2011-06-25 NOTE — Progress Notes (Signed)
Michael Glass Date of Birth: 01/30/1946   History of Present Illness: Michael Glass is seen for a follow up visit today. He is seen for Dr. Antoine Poche. He is a former patient of Dr. Ronnald Nian. He was going to go to the [facility] office, but it has closed. He will be coming here. He is doing ok. No chest pain. No elbow pain. He is walking only about a mile a day. His weight is climbing back up. He is tolerating his medicines. He is fasting today and we will check his labs. Overall, he is pleased with how he feels.   Current Outpatient Prescriptions on File Prior to Visit  Medication Sig Dispense Refill  . aspirin 81 MG tablet Take 81 mg by mouth 4 (four) times daily.       Marland Kitchen atenolol (TENORMIN) 25 MG tablet Take 1 tablet (25 mg total) by mouth daily.  30 tablet  6  . fish oil-omega-3 fatty acids 1000 MG capsule Take 2 g by mouth daily.        Marland Kitchen gabapentin (NEURONTIN) 300 MG capsule Take 600 mg by mouth 3 (three) times daily.        Marland Kitchen levothyroxine (SYNTHROID, LEVOTHROID) 88 MCG tablet Take 88 mcg by mouth daily.        . Multiple Vitamins-Minerals (MEGA MULTI MEN PO) Take by mouth daily.        . nitroGLYCERIN (NITROSTAT) 0.4 MG SL tablet Place 0.4 mg under the tongue every 5 (five) minutes as needed.        . rosuvastatin (CRESTOR) 5 MG tablet Take 5 mg by mouth 2 (two) times a week.        Marland Kitchen ZOLPIDEM TARTRATE PO Take by mouth at bedtime.        Marland Kitchen DISCONTD: fenofibrate (TRICOR) 145 MG tablet Take 145 mg by mouth daily.          Allergies  Allergen Reactions  . Sulfa Drugs Cross Reactors     Past Medical History  Diagnosis Date  . Hypertension   . Seroma     RIGHT LEG  . Hearing loss   . Ischemic heart disease 1999    PREVIOUS STENTS IN THE LAD, AND RIGHT CORONARY ; S/P CABG in November 2011  . Hypercholesterolemia   . Gouty arthritis   . Obesity   . 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 Coronary artery bypass graft 08/11/10     LIMA to LAD, SVG to 1st DX, SVG to OM1 and SVG to PD  . Cardiac catheterization 2007, 2011  . Shoulder surgery     RIGHT SHOULDER  . Knee arthroscopy 1982    RIGHT KNEE    History  Smoking status  . Former Smoker  . Quit date: 09/21/1999  Smokeless tobacco  . Not on file    History  Alcohol Use No    Family History  Problem Relation Age of Onset  . Hypertension Mother   . Parkinsonism Father   . Heart disease Father   . Angina Sister     Review of Systems: The review of systems is positive for some arthritis. No cardiac complaints. Blood pressure at home is good.  He has had a residual seroma from his vein harvesting site that sometimes bothers him. All other systems were reviewed and are negative.  Physical Exam: BP 136/82  Pulse 56  Ht 6' (1.829 m)  Wt 237 lb (107.502 kg)  BMI 32.14 kg/m2 Patient is very pleasant and in no acute distress. Skin is warm and dry. Color is normal.  HEENT is unremarkable. Normocephalic/atraumatic. PERRL. Sclera are nonicteric. Neck is supple. No masses. No JVD. Lungs are clear. Cardiac exam shows a regular rate and rhythm. Sernum looks good. Abdomen is obese but soft. Extremities are without edema. Gait and ROM are intact. No gross neurologic deficits noted.   LABORATORY DATA: PENDING   Assessment / Plan:

## 2011-06-25 NOTE — Assessment & Plan Note (Signed)
Weight loss is encouraged 

## 2011-06-25 NOTE — Assessment & Plan Note (Signed)
Labs are checked today. He is only on Crestor two times a week due to history of myalgias.

## 2011-07-27 IMAGING — CR DG CHEST 1V PORT
1 series · 1 of 1 positions shown · non-contrast
Comparison: Chest x-ray 07/24/2010.

CLINICAL DATA: Chest pain and shortness of breath.

PORTABLE CHEST - 1 VIEW

[AP]
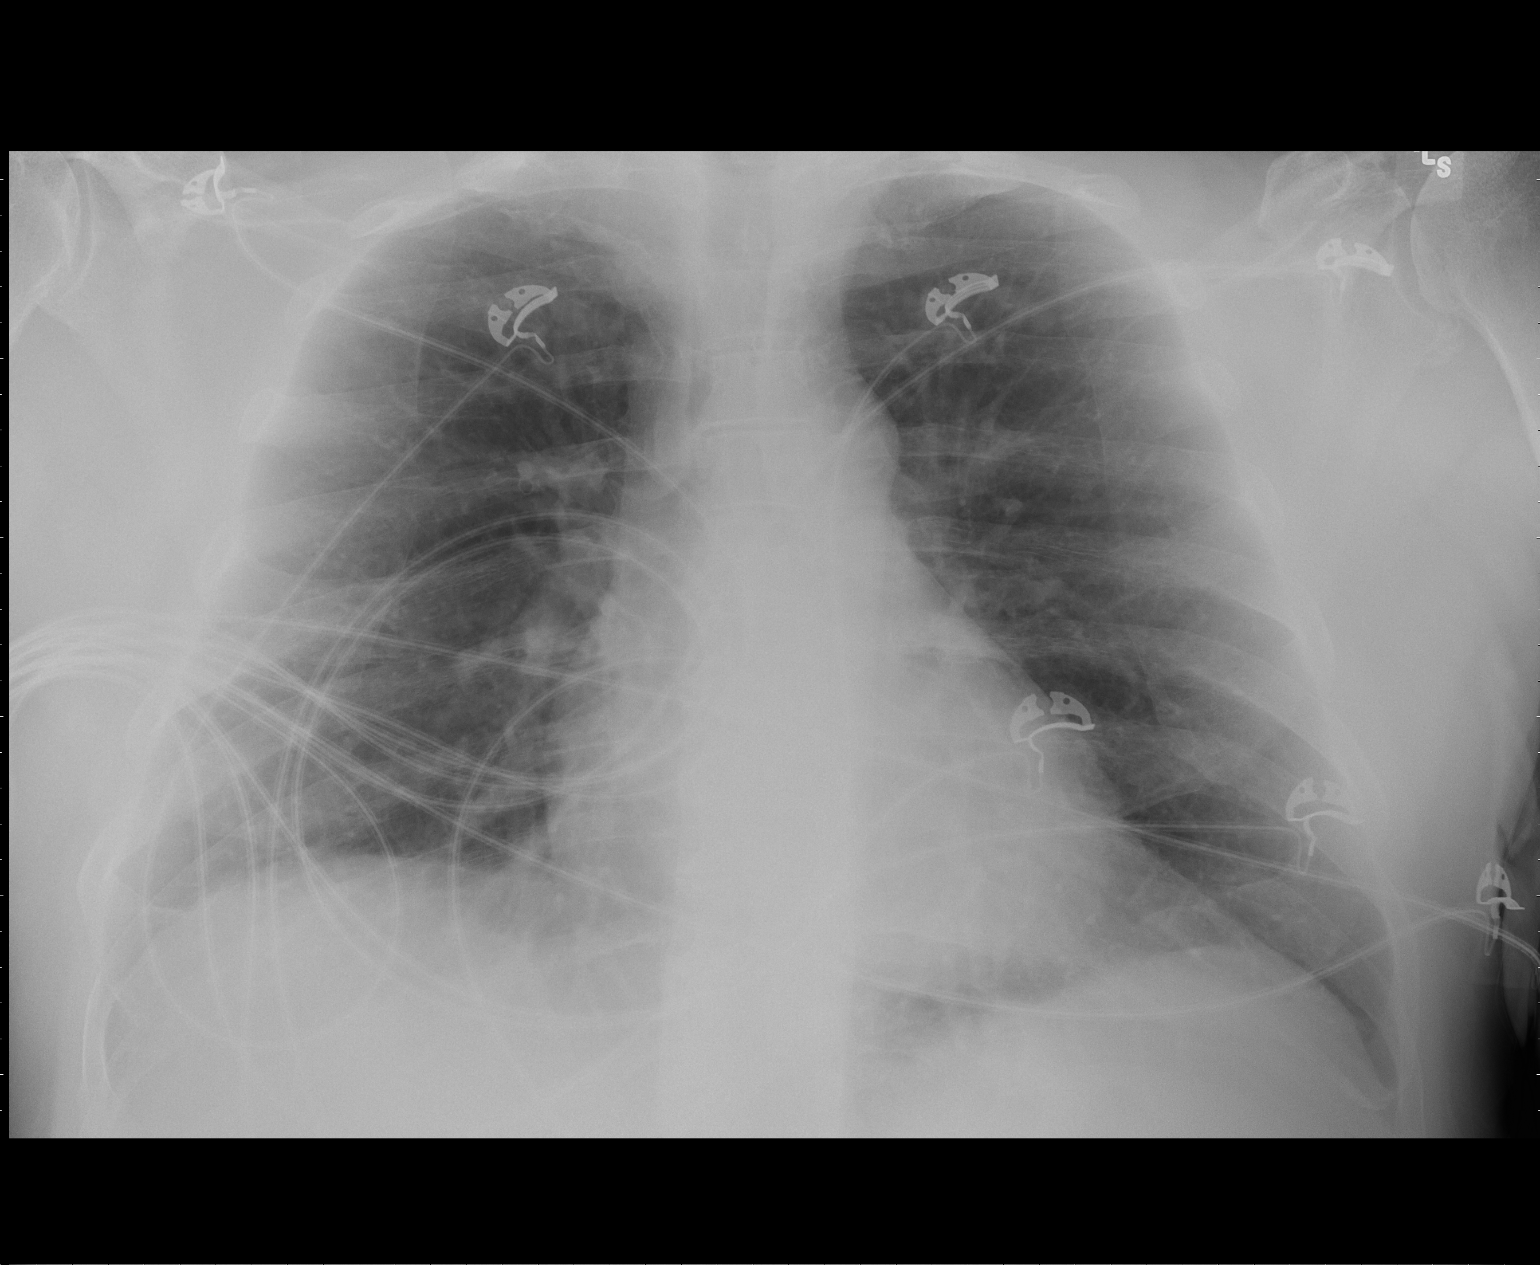

[1 of 1 positions shown; findings below may reference images not displayed]

FINDINGS: The cardiac silhouette, mediastinal and hilar contours
are within normal limits.  The lungs are clear.  The bony thorax is
intact.
IMPRESSION: No acute cardiopulmonary findings.

## 2011-07-29 IMAGING — CR DG CHEST 1V PORT
1 series · 1 of 1 positions shown · non-contrast
Comparison: 08/09/2010.

CLINICAL DATA: Status post CABG.

PORTABLE CHEST - 1 VIEW

[view not recorded]
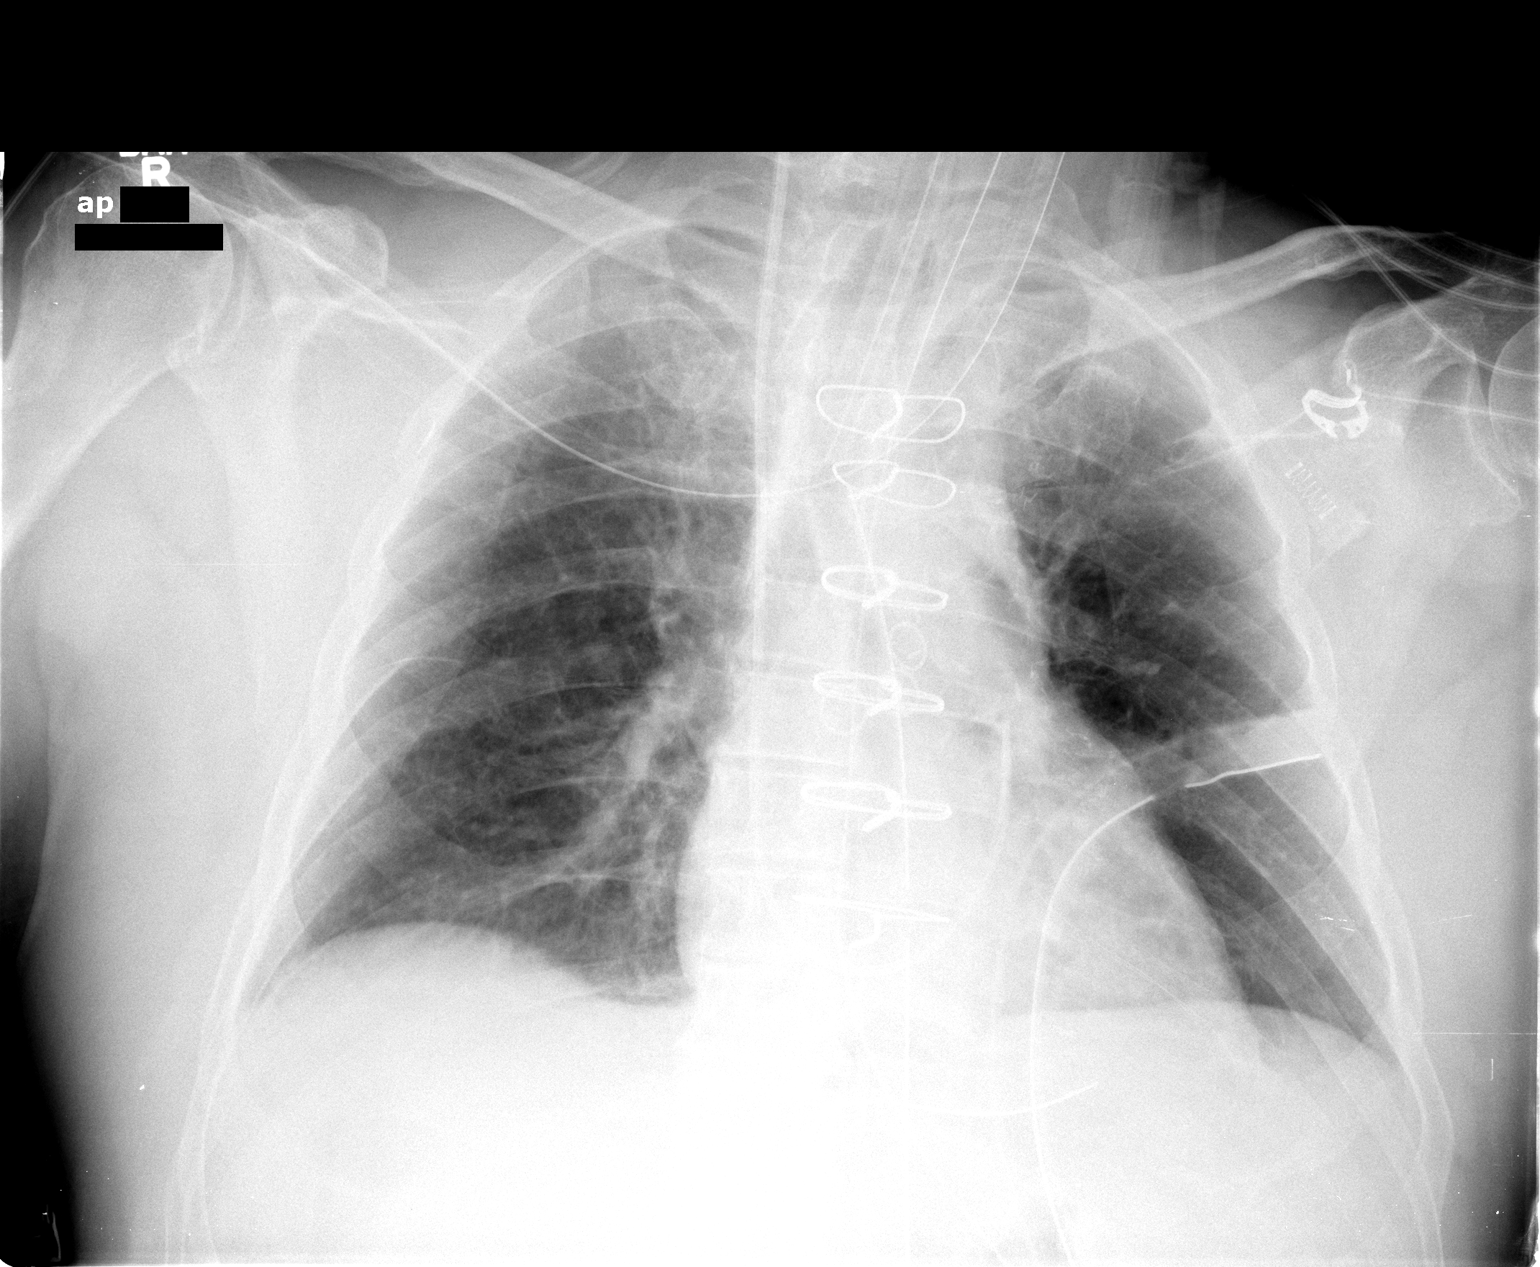

[1 of 1 positions shown; findings below may reference images not displayed]

FINDINGS: Endotracheal tube tip 3.7 cm above the carina.
Nasogastric tube courses below the diaphragm.  The tip is not
included on this exam.  Right-sided Swan-Ganz catheter tip
pulmonary outflow tract/lower main pulmonary artery level.
Mediastinal drains left-sided down chest tube in place.  No gross
pneumothorax.

Surgical clips from recent CABG.  Heart size within normal limits.
Indistinct thoracic aorta may be related to adjacent atelectatic
changes.
IMPRESSION: Right-sided Swan-Ganz catheter tip pulmonary outflow tract/lower
main pulmonary artery level.

Mediastinal drains left-sided down chest tube in place.  No gross
pneumothorax.

This is a call report.

## 2011-07-30 IMAGING — CR DG CHEST 1V PORT
1 series · 1 of 1 positions shown · non-contrast
Comparison: 08/11/2010

CLINICAL DATA: Coronary bypass

PORTABLE CHEST - 1 VIEW

[AP]
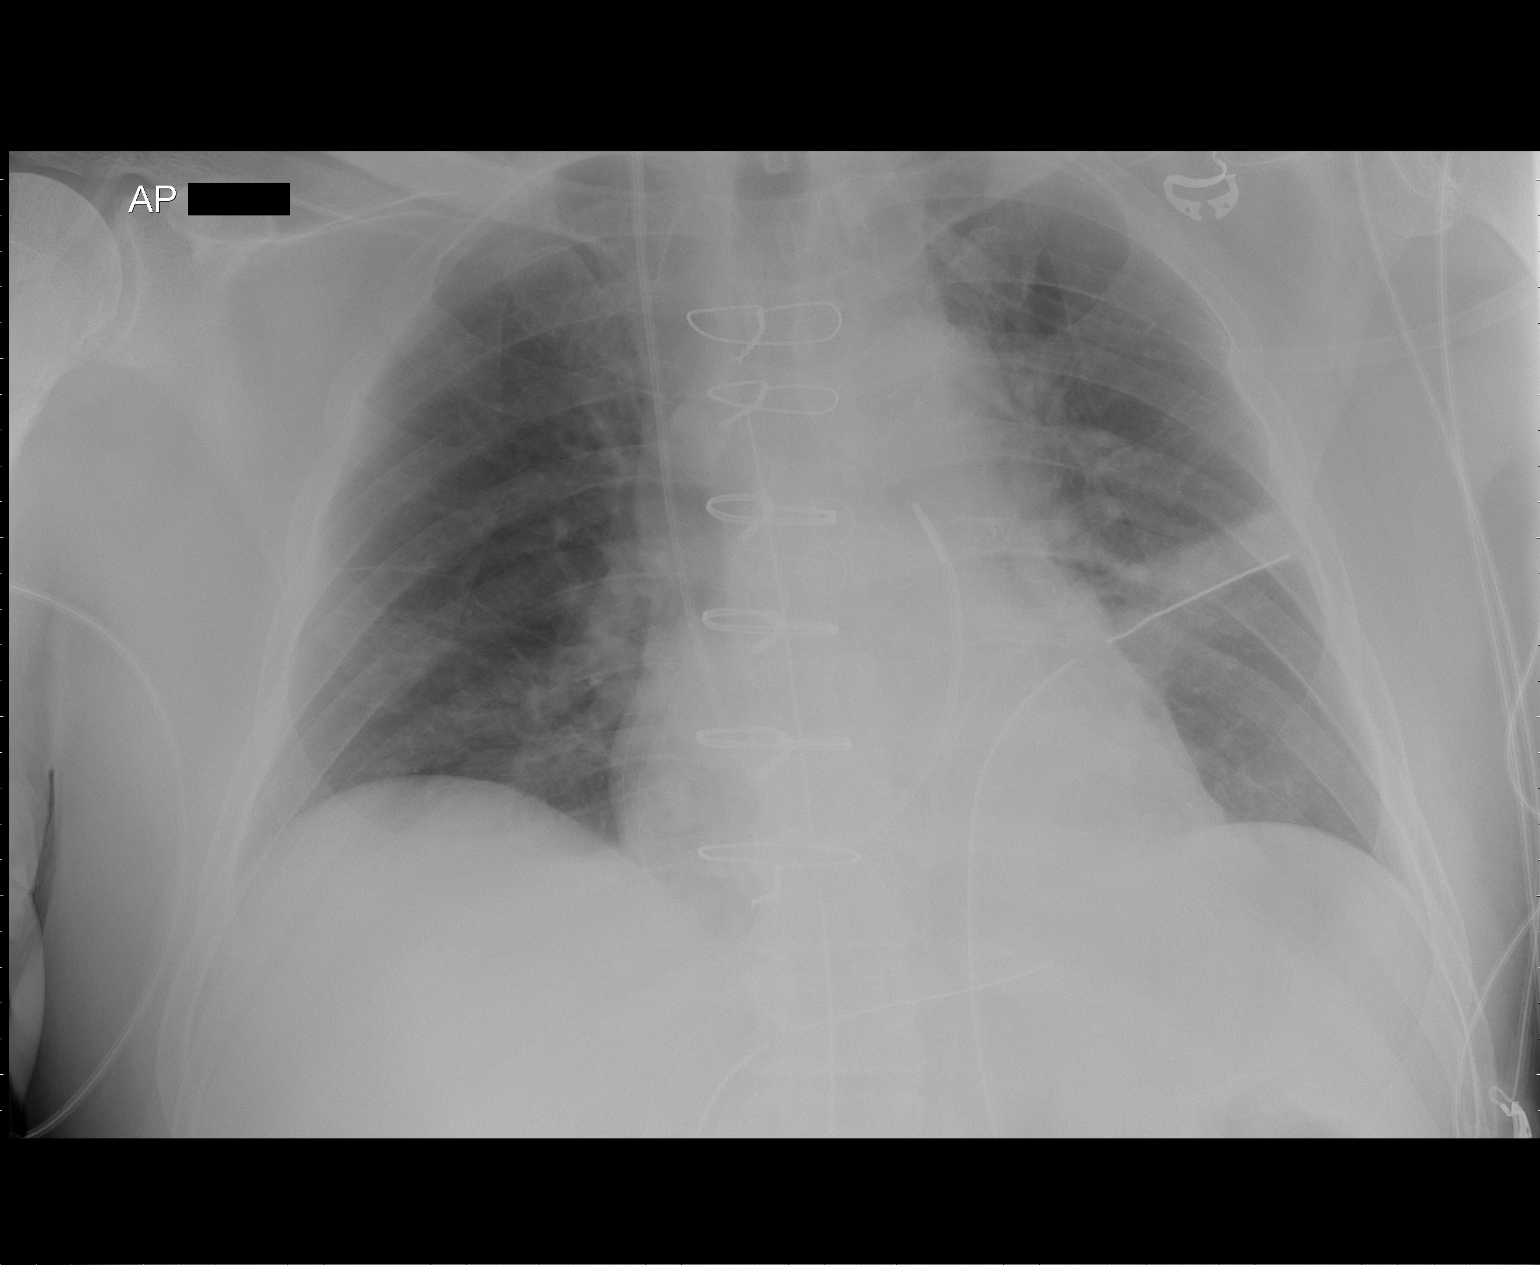

[1 of 1 positions shown; findings below may reference images not displayed]

FINDINGS: Interval extubation.  NG tube also removed.  Other
support apparatus stable.  Decreased lung volumes with residual
vascular congestion and basilar atelectasis.  No enlarging effusion
or pneumothorax.  Stable exam.
IMPRESSION: Extubated.  Stable portable chest exam.

## 2011-11-25 ENCOUNTER — Ambulatory Visit (INDEPENDENT_AMBULATORY_CARE_PROVIDER_SITE_OTHER): Payer: Managed Care, Other (non HMO) | Admitting: Cardiology

## 2011-11-25 ENCOUNTER — Encounter: Payer: Self-pay | Admitting: Cardiology

## 2011-11-25 DIAGNOSIS — I10 Essential (primary) hypertension: Secondary | ICD-10-CM

## 2011-11-25 DIAGNOSIS — E7800 Pure hypercholesterolemia, unspecified: Secondary | ICD-10-CM

## 2011-11-25 DIAGNOSIS — I6529 Occlusion and stenosis of unspecified carotid artery: Secondary | ICD-10-CM

## 2011-11-25 MED ORDER — ATENOLOL 50 MG PO TABS: 50.0000 mg | ORAL_TABLET | Freq: Every day | ORAL | Status: DC

## 2011-11-25 NOTE — Progress Notes (Signed)
HPI The patient presents for followup of coronary disease. The patient presents as a new patient for me having previously been treated by Dr.Tennant.  Since last being seen he has done well. He works vigorously and he gets no symptoms. He denies any chest pressure, neck or arm discomfort. He has no palpitations, presyncope or syncope. He has no shortness of breath, PND or orthopnea. He does have some occasional left upper chest soreness with movement that he describes to residual pain post sternotomy. This is mild.  Allergies  Allergen Reactions  . Sulfa Drugs Cross Reactors     Current Outpatient Prescriptions  Medication Sig Dispense Refill  . aspirin 81 MG tablet Take 81 mg by mouth 4 (four) times daily.       Marland Kitchen atenolol (TENORMIN) 25 MG tablet Take 1 tablet (25 mg total) by mouth daily.  30 tablet  6  . fenofibrate 160 MG tablet Take 160 mg by mouth daily.        . fish oil-omega-3 fatty acids 1000 MG capsule Take 2 g by mouth daily.        Marland Kitchen gabapentin (NEURONTIN) 300 MG capsule Take 600 mg by mouth 3 (three) times daily.        Marland Kitchen levothyroxine (SYNTHROID, LEVOTHROID) 88 MCG tablet Take 88 mcg by mouth daily.        . Multiple Vitamins-Minerals (MEGA MULTI MEN PO) Take by mouth daily.        . nitroGLYCERIN (NITROSTAT) 0.4 MG SL tablet Place 0.4 mg under the tongue every 5 (five) minutes as needed.        . rosuvastatin (CRESTOR) 5 MG tablet Take 5 mg by mouth 2 (two) times a week.        Marland Kitchen ZOLPIDEM TARTRATE PO Take by mouth at bedtime.          Past Medical History  Diagnosis Date  . Hypertension   . Seroma     RIGHT LEG  . Hearing loss   . Ischemic heart disease 1999    PREVIOUS STENTS IN THE LAD, AND RIGHT CORONARY ; S/P CABG in November 2011  . Hypercholesterolemia   . Gouty arthritis   . Obesity   . 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 Coronary artery bypass graft 08/11/10    LIMA to LAD, SVG to 1st DX, SVG  to OM1 and SVG to PD  . Cardiac catheterization 2007, 2011  . Shoulder surgery     RIGHT SHOULDER  . Knee arthroscopy 1982    RIGHT KNEE    ROS:  As stated in the HPI and negative for all other systems.  PHYSICAL EXAM There were no vitals taken for this visit. GENERAL:  Well appearing HEENT:  Pupils equal round and reactive, fundi not visualized, oral mucosa unremarkable NECK:  No jugular venous distention, waveform within normal limits, carotid upstroke brisk and symmetric, no bruits, no thyromegaly LYMPHATICS:  No cervical, inguinal adenopathy LUNGS:  Clear to auscultation bilaterally BACK:  No CVA tenderness CHEST:  Well healed sternotomy scar. HEART:  PMI not displaced or sustained,S1 and S2 within normal limits, no S3, no S4, no clicks, no rubs, no murmurs ABD:  Flat, positive bowel sounds normal in frequency in pitch, no bruits, no rebound, no guarding, no midline pulsatile mass, no hepatomegaly, no splenomegaly EXT:  2 plus pulses throughout, no edema, no cyanosis no clubbing SKIN:  No rashes no  nodules NEURO:  Cranial nerves II through XII grossly intact, motor grossly intact throughout PSYCH:  Cognitively intact, oriented to person place and time  EKG:  Sinus rhythm, rate 57, axis within normal limits, intervals within normal limits, no acute ST-T wave changes.  ASSESSMENT AND PLAN

## 2011-11-25 NOTE — Assessment & Plan Note (Signed)
His blood pressure has been running a. He's been taking 50 mg of atenolol and feels much better with this. He says he controls his blood pressure better. I will change that dose.

## 2011-11-25 NOTE — Assessment & Plan Note (Signed)
The patient has no new sypmtoms.  No further cardiovascular testing is indicated.  We will continue with aggressive risk reduction and meds as listed.  

## 2011-11-25 NOTE — Progress Notes (Deleted)
HPI  Allergies  Allergen Reactions  . Sulfa Drugs Cross Reactors     Current Outpatient Prescriptions  Medication Sig Dispense Refill  . aspirin 81 MG tablet Take 81 mg by mouth 4 (four) times daily.       Marland Kitchen atenolol (TENORMIN) 25 MG tablet Take 1 tablet (25 mg total) by mouth daily.  30 tablet  6  . doxycycline (ADOXA) 50 MG tablet Take 50 mg by mouth 2 (two) times daily.      . fenofibrate 160 MG tablet Take 160 mg by mouth daily.        . fish oil-omega-3 fatty acids 1000 MG capsule Take 2 g by mouth daily.        Marland Kitchen gabapentin (NEURONTIN) 300 MG capsule Take 600 mg by mouth 3 (three) times daily.        Marland Kitchen levothyroxine (SYNTHROID, LEVOTHROID) 88 MCG tablet Take 88 mcg by mouth daily.        . Multiple Vitamins-Minerals (MEGA MULTI MEN PO) Take by mouth daily.        . nitroGLYCERIN (NITROSTAT) 0.4 MG SL tablet Place 0.4 mg under the tongue every 5 (five) minutes as needed.        Marland Kitchen omeprazole (PRILOSEC) 20 MG capsule Take 20 mg by mouth daily.      . rosuvastatin (CRESTOR) 5 MG tablet Take 5 mg by mouth 2 (two) times a week.        . traZODone (DESYREL) 50 MG tablet Take 50 mg by mouth at bedtime.      Marland Kitchen ZOLPIDEM TARTRATE PO Take by mouth at bedtime.          Past Medical History  Diagnosis Date  . Hypertension   . Seroma     RIGHT LEG  . Hearing loss   . Ischemic heart disease 1999    PREVIOUS STENTS IN THE LAD, AND RIGHT CORONARY ; S/P CABG in November 2011  . Hypercholesterolemia   . Gouty arthritis   . Obesity   . 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 Coronary artery bypass graft 08/11/10    LIMA to LAD, SVG to 1st DX, SVG to OM1 and SVG to PD  . Cardiac catheterization 2007, 2011  . Shoulder surgery     RIGHT SHOULDER  . Knee arthroscopy 1982    RIGHT KNEE    ROS:  ED, arthritis.  Otherwise as stated in the HPI and negative for all other systems.  PHYSICAL EXAM BP 144/86  Pulse 57  Ht 6' (1.829 m)   Wt 229 lb (103.874 kg)  BMI 31.06 kg/m2 GENERAL:  Well appearing HEENT:  Pupils equal round and reactive, fundi not visualized, oral mucosa unremarkable NECK:  No jugular venous distention, waveform within normal limits, carotid upstroke brisk and symmetric, no bruits, no thyromegaly LYMPHATICS:  No cervical, inguinal adenopathy LUNGS:  Clear to auscultation bilaterally BACK:  No CVA tenderness CHEST:  Well healed sternotomy scar. HEART:  PMI not displaced or sustained,S1 and S2 within normal limits, no S3, no S4, no clicks, no rubs, no murmurs ABD:  Flat, positive bowel sounds normal in frequency in pitch, no bruits, no rebound, no guarding, no midline pulsatile mass, no hepatomegaly, no splenomegaly EXT:  2 plus pulses throughout, no edema, no cyanosis no clubbing SKIN:  No rashes no nodules NEURO:  Cranial nerves II through XII grossly intact, motor grossly intact throughout PSYCH:  Cognitively intact, oriented to person place and time  EKG:  Sinus rhythm, rate 57, axis within normal limits, intervals within normal limits, no acute ST-T wave changes.  ASSESSMENT AND PLAN

## 2011-11-25 NOTE — Patient Instructions (Addendum)
Please increase your Atenolol to 50 mg a day. Continue all other medications as listed.  Follow up in 6 months with Dr Antoine Poche.  You will receive a letter in the mail 2 months before you are due.  Please call us when you receive this letter to schedule your follow up appointment.  You will need a carotid doppler and a fasting lipid profile the same day you see Dr Antoine Poche in 6 months. (September)

## 2011-11-25 NOTE — Assessment & Plan Note (Signed)
He had some mild stenosis on the right before his bypass. He will get carotid Dopplers when he returns for followup.

## 2011-11-25 NOTE — Assessment & Plan Note (Signed)
His LDL was 119 in January with an HDL of 44. He wasn't taking his medications at that time and he has since resumed. When he comes back he will be fasting and I will repeat a lipid with a goal LDL less than 100.

## 2012-02-23 ENCOUNTER — Telehealth: Payer: Self-pay | Admitting: Cardiology

## 2012-02-23 NOTE — Telephone Encounter (Signed)
New Problem:    Patient called in because his blood pressure is going up and coming down, he feels slightly dizzy, and he would like to know how to proceed.  Please call back.

## 2012-02-24 ENCOUNTER — Encounter: Payer: Self-pay | Admitting: *Deleted

## 2012-02-24 ENCOUNTER — Other Ambulatory Visit: Payer: Self-pay | Admitting: Internal Medicine

## 2012-02-24 ENCOUNTER — Encounter: Payer: Self-pay | Admitting: Internal Medicine

## 2012-02-24 ENCOUNTER — Ambulatory Visit (INDEPENDENT_AMBULATORY_CARE_PROVIDER_SITE_OTHER): Payer: Managed Care, Other (non HMO) | Admitting: Internal Medicine

## 2012-02-24 VITALS — BP 131/80 | HR 66 | Ht 72.0 in | Wt 234.0 lb

## 2012-02-24 DIAGNOSIS — R072 Precordial pain: Secondary | ICD-10-CM

## 2012-02-24 DIAGNOSIS — R079 Chest pain, unspecified: Secondary | ICD-10-CM

## 2012-02-24 NOTE — Assessment & Plan Note (Signed)
The patient has new onset chest pain with known coronary artery disease. Although his bypass his relatively recent, the crescendo nature of the symptoms and their similarity to his pre-revascularization symptoms increased the pretest probability to moderate-high. As such, I think he should undergo catheterization. I have reviewed this with Dr. [facility] who is in the cath lab tomorrow. He will be able to do the procedure.

## 2012-02-24 NOTE — Telephone Encounter (Signed)
Needs to be worked into the clinic this week.

## 2012-02-24 NOTE — Patient Instructions (Signed)

## 2012-02-24 NOTE — Telephone Encounter (Signed)
appt today at 3:15 p.m.

## 2012-02-24 NOTE — Telephone Encounter (Signed)
Pt reports having a dizzy, lightheaded, swimmy headed feeling on occasion.  He mostly notices it while sitting.  His BP are 110/70 HR 65-70 when this occurs.  He doesn't notice it so much when up moving around but it has happened a few times.  He reports his normal BP is 130/65-75 and HR 65 - 75.  When this occurs it lasts about 30 seconds and he denies any pain or SOB. Also very concerning - while walking yesterday his left elbow started throbbing and this lasted appr. 2 mins.  It resolved on its own and he denies any other s/s.  He does admit however that any time he has had "heart problems" in the past he only had elbow pain.  He has not s/s today. He has a hx of CABG 11/11.  He is getting ready to leave for vacation on Tuesday headed to [place] for 2 weeks. Aware I will review with MD.  He was scheduled to be seen however his appt is not until 7/2.

## 2012-02-24 NOTE — Progress Notes (Signed)
HPI  Michael Glass 66 year old male seen as does a day.  He has a history of ischemic heart disease and is status post bypass grafting November 2011 His daughter got married 2 or 3 weeks ago.\ After stacking some hay , he started walking up the incline and developed a severe left elbow pain profound diaphoresis and shortness of breath. This was reminiscent of the discomfort prior to his bypass surgery. His symptoms abated over 5 or 10 minutes. He then sometime later this with his daughter and became exceedingly short of breath. This occasion was not associated with left elbow pain.  A couple of days later he was walking again up an incline. He had been walking 2 miles a day. After only about 500 feet, however, he developed left elbow discomfort again shortness of breath and had to stop.  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 He has not noted rest discomfort edema recumbent dyspnea   Past Medical History  Diagnosis Date  . Hypertension   . Seroma     RIGHT LEG  . Hearing loss   . Ischemic heart disease 1999    PREVIOUS STENTS IN THE LAD, AND RIGHT CORONARY ; S/P CABG in November 2011  . Hypercholesterolemia   . Gouty arthritis   . Obesity   . 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 Coronary artery bypass graft 08/11/10    LIMA to LAD, SVG to 1st DX, SVG to OM1 and SVG to PD  . Cardiac catheterization 2007, 2011  . Shoulder surgery     RIGHT SHOULDER  . Knee arthroscopy 1982    RIGHT KNEE    Current Outpatient Prescriptions  Medication Sig Dispense Refill  . aspirin 81 MG tablet Take 81 mg by mouth 4 (four) times daily.       . atenolol (TENORMIN) 50 MG tablet Take 1 tablet (50 mg total) by mouth daily.  90 tablet  3  . doxycycline (ADOXA) 50 MG tablet Take 50 mg by mouth 2 (two) times daily.      . fenofibrate 160 MG tablet Take 160 mg by mouth daily.        . fish oil-omega-3 fatty acids 1000 MG capsule Take  2 g by mouth daily.        . gabapentin (NEURONTIN) 300 MG capsule Take 600 mg by mouth 3 (three) times daily.        . levothyroxine (SYNTHROID, LEVOTHROID) 88 MCG tablet Take 88 mcg by mouth daily.        . Multiple Vitamins-Minerals (MEGA MULTI MEN PO) Take by mouth daily.        . nitroGLYCERIN (NITROSTAT) 0.4 MG SL tablet Place 0.4 mg under the tongue every 5 (five) minutes as needed.        . omeprazole (PRILOSEC) 20 MG capsule Take 20 mg by mouth daily.      . rosuvastatin (CRESTOR) 5 MG tablet Take 5 mg by mouth 2 (two) times a week.        . traZODone (DESYREL) 50 MG tablet Take 50 mg by mouth at bedtime.      . ZOLPIDEM TARTRATE PO Take by mouth at bedtime.          Allergies  Allergen Reactions  . Sulfa Drugs Cross Reactors     Review of Systems negative except from HPI and PMH  Physical Exam BP   131/80  Pulse 66  Ht 6' (1.829 m)  Wt 234 lb (106.142 kg)  BMI 31.74 kg/m2 Well developed and well nourished in no acute distress HENT normal E scleral and icterus clear Neck Supple JVP flat; carotids brisk and full Clear to ausculation Regular rate and rhythm, no murmurs gallops or rub Soft with active bowel sounds No clubbing cyanosis none Edema Alert and oriented, grossly normal motor and sensory function Skin Warm and Dry  Electrocardiogram demonstrates sinus rhythm at 61 Intervals 19/07/39 Old septal MI these represent loss of R waves compared to March  Assessment and  Plan  

## 2012-02-24 NOTE — Telephone Encounter (Signed)
Reports BP has been dropping to 110/70

## 2012-02-25 ENCOUNTER — Encounter (HOSPITAL_COMMUNITY)
Admission: RE | Disposition: A | Discharge status: Home / Self Care | Payer: Self-pay | Source: Ambulatory Visit | Attending: Cardiology

## 2012-02-25 ENCOUNTER — Ambulatory Visit (HOSPITAL_COMMUNITY)
Admission: RE | Admit: 2012-02-25 | Discharge: 2012-02-25 | Disposition: A | Discharge status: Home / Self Care | Payer: Managed Care, Other (non HMO) | Source: Ambulatory Visit | Attending: Cardiology | Admitting: Cardiology

## 2012-02-25 ENCOUNTER — Telehealth: Payer: Self-pay | Admitting: *Deleted

## 2012-02-25 DIAGNOSIS — I10 Essential (primary) hypertension: Secondary | ICD-10-CM | POA: Insufficient documentation

## 2012-02-25 DIAGNOSIS — I2510 Atherosclerotic heart disease of native coronary artery without angina pectoris: Secondary | ICD-10-CM

## 2012-02-25 DIAGNOSIS — Z951 Presence of aortocoronary bypass graft: Secondary | ICD-10-CM | POA: Insufficient documentation

## 2012-02-25 DIAGNOSIS — R002 Palpitations: Secondary | ICD-10-CM

## 2012-02-25 DIAGNOSIS — I200 Unstable angina: Secondary | ICD-10-CM | POA: Insufficient documentation

## 2012-02-25 DIAGNOSIS — E669 Obesity, unspecified: Secondary | ICD-10-CM | POA: Insufficient documentation

## 2012-02-25 DIAGNOSIS — R072 Precordial pain: Secondary | ICD-10-CM

## 2012-02-25 DIAGNOSIS — E7800 Pure hypercholesterolemia, unspecified: Secondary | ICD-10-CM | POA: Insufficient documentation

## 2012-02-25 HISTORY — PX: LEFT HEART CATHETERIZATION WITH CORONARY/GRAFT ANGIOGRAM: SHX5450

## 2012-02-25 LAB — CBC WITH DIFFERENTIAL/PLATELET
Basophils Relative: 1.7 % (ref 0.0–3.0)
Eosinophils Relative: 3.6 % (ref 0.0–5.0)
HCT: 41.2 % (ref 39.0–52.0)
Monocytes Relative: 6.6 % (ref 3.0–12.0)
Neutrophils Relative %: 63.3 % (ref 43.0–77.0)
Platelets: 240 10*3/uL (ref 150.0–400.0)
RBC: 4.7 Mil/uL (ref 4.22–5.81)
WBC: 7.3 10*3/uL (ref 4.5–10.5)

## 2012-02-25 LAB — BASIC METABOLIC PANEL
BUN: 16 mg/dL (ref 6–23)
Creatinine, Ser: 1.3 mg/dL (ref 0.4–1.5)
GFR: 57 mL/min — ABNORMAL LOW (ref >60.00)
Potassium: 4.4 mEq/L (ref 3.5–5.1)

## 2012-02-25 LAB — PROTIME-INR
INR: 1 ratio (ref 0.8–1.0)
Prothrombin Time: 11.5 s (ref 10.2–12.4)

## 2012-02-25 SURGERY — LEFT HEART CATHETERIZATION WITH CORONARY/GRAFT ANGIOGRAM
Anesthesia: LOCAL

## 2012-02-25 MED ORDER — SODIUM CHLORIDE 0.9 % IV SOLN: 250.0000 mL | INTRAVENOUS | Status: DC | PRN

## 2012-02-25 MED ORDER — SODIUM CHLORIDE 0.9 % IJ SOLN: 3.0000 mL | Freq: Two times a day (BID) | INTRAMUSCULAR | Status: DC

## 2012-02-25 MED ORDER — MIDAZOLAM HCL 2 MG/2ML IJ SOLN
INTRAMUSCULAR | Status: AC
  Filled 2012-02-25: qty 2

## 2012-02-25 MED ORDER — HEPARIN (PORCINE) IN NACL 2-0.9 UNIT/ML-% IJ SOLN
INTRAMUSCULAR | Status: AC
  Filled 2012-02-25: qty 2000

## 2012-02-25 MED ORDER — ASPIRIN 81 MG PO CHEW
324.0000 mg | CHEWABLE_TABLET | ORAL | Status: AC
  Administered 2012-02-25: 324 mg via ORAL

## 2012-02-25 MED ORDER — SODIUM CHLORIDE 0.9 % IJ SOLN: 3.0000 mL | INTRAMUSCULAR | Status: DC | PRN

## 2012-02-25 MED ORDER — ASPIRIN 81 MG PO CHEW
CHEWABLE_TABLET | ORAL | Status: AC
  Filled 2012-02-25: qty 1

## 2012-02-25 MED ORDER — SODIUM CHLORIDE 0.9 % IV SOLN: INTRAVENOUS | Status: DC

## 2012-02-25 MED ORDER — ONDANSETRON HCL 4 MG/2ML IJ SOLN: 4.0000 mg | Freq: Four times a day (QID) | INTRAMUSCULAR | Status: DC | PRN

## 2012-02-25 MED ORDER — ACETAMINOPHEN 325 MG PO TABS: 650.0000 mg | ORAL_TABLET | ORAL | Status: DC | PRN

## 2012-02-25 MED ORDER — LIDOCAINE HCL (PF) 1 % IJ SOLN
INTRAMUSCULAR | Status: AC
  Filled 2012-02-25: qty 30

## 2012-02-25 MED ORDER — ASPIRIN 81 MG PO CHEW
CHEWABLE_TABLET | ORAL | Status: AC
  Filled 2012-02-25: qty 3

## 2012-02-25 MED ORDER — FENTANYL CITRATE 0.05 MG/ML IJ SOLN
INTRAMUSCULAR | Status: AC
  Filled 2012-02-25: qty 2

## 2012-02-25 MED ORDER — NITROGLYCERIN 0.2 MG/ML ON CALL CATH LAB
INTRAVENOUS | Status: AC
  Filled 2012-02-25: qty 1

## 2012-02-25 MED ORDER — ONDANSETRON HCL 4 MG/2ML IJ SOLN
INTRAMUSCULAR | Status: AC
  Filled 2012-02-25: qty 2

## 2012-02-25 NOTE — CV Procedure (Signed)
  Cardiac Catheterization Procedure Note  Name: Michael Glass MRN: [id number] DOB: 09/27/1945  Procedure: Left Heart Cath, Selective Coronary Angiography, LV angiography  Indication:   Unstable angina, CAD s/p CABG 2011  Procedural details: The right groin was prepped, draped, and anesthetized with 1% lidocaine. Using modified Seldinger technique, a 5 French sheath was introduced into the right femoral artery. Standard Judkins catheters were used for coronary angiography and left ventriculography. Catheter exchanges were performed over a guidewire. There were no immediate procedural complications. The patient was transferred to the post catheterization recovery area for further monitoring.  Procedural Findings:  Hemodynamics:     AO 120/62    LV 102/3   Coronary angiography:   Coronary dominance: Right  Left mainstem:   Normal  Left anterior descending (LAD):   Proximal long 60 - 70%.  Severe mid diffuse disease.  Distal vessel with mild diffuse plaque Distal vessel fills via LIMA.  Large diagonal previously stented.  Stent patent with in stent diffuse 25%.  Vessel fills via SVG and native flow.  Mid and distal diagonal free of high grade disease.    Left circumflex (LCx):  Moderate sized OM1 with proximal long 70%.  Vessel fills predominantly via SVG.  Distal OM1 is free of high grade disease.  OM2 large with long 40% stenosis.  Right coronary artery (RCA):  Mild diffuse plaque.  Proximal 25%.  Distal long 30%. PDA long prox 75% with mid mild plaque.  PDA fills via SVG  Grafts:     LIMA to LAD:  Widely patent   SVG to PDA:  Widely patent with proximal long 25%   SVG to Diag:  SVG to widely patent   SVG to OM:  Patent with long 40% mid/distal stenosis.  Left ventriculography: Left ventricular systolic function is normal, LVEF is estimated at 60, there is no significant mitral regurgitation   Final Conclusions:  Severe native vessel disease but with patent bypass grafts.  NL LV  function.    Recommendations: I will follow up "spells of weakness" with an event recorder.  Continue risk reduction.    Rollene Rotunda 02/25/2012, 2:00 PM

## 2012-02-25 NOTE — Telephone Encounter (Signed)
Message copied by Sharin Grave on Fri Feb 25, 2012  2:34 PM ------      Message from: Rollene Rotunda      Created: Fri Feb 25, 2012  2:14 PM       He will be back from [place] in about two weeks and will need an event monitor for palpitations.

## 2012-02-25 NOTE — Interval H&P Note (Signed)
History and Physical Interval Note:  02/25/2012 1:18 PM  Michael Glass  has presented today for surgery, with the diagnosis of Chest Pain   The various methods of treatment have been discussed with the patient and family. After consideration of risks, benefits and other options for treatment, the patient has consented to  Procedure(s) (LRB): LEFT HEART CATHETERIZATION WITH CORONARY/GRAFT ANGIOGRAM (N/A) as a surgical intervention .  The patients' history has been reviewed, patient examined, no change in status, stable for surgery.  I have reviewed the patients' chart and labs.  Questions were answered to the patient's satisfaction.     Rollene Rotunda

## 2012-02-25 NOTE — H&P (View-Only) (Signed)
HPI  Michael Glass 66 year old male seen as does a day.  He has a history of ischemic heart disease and is status post bypass grafting November 2011 His daughter got married 2 or 3 weeks ago.\ After stacking some hay , he started walking up the incline and developed a severe left elbow pain profound diaphoresis and shortness of breath. This was reminiscent of the discomfort prior to his bypass surgery. His symptoms abated over 5 or 10 minutes. He then sometime later this with his daughter and became exceedingly short of breath. This occasion was not associated with left elbow pain.  A couple of days later he was walking again up an incline. He had been walking 2 miles a day. After only about 500 feet, however, he developed left elbow discomfort again shortness of breath and had to stop.  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 He has not noted rest discomfort edema recumbent dyspnea   Past Medical History  Diagnosis Date  . Hypertension   . Seroma     RIGHT LEG  . Hearing loss   . Ischemic heart disease 1999    PREVIOUS STENTS IN THE LAD, AND RIGHT CORONARY ; S/P CABG in November 2011  . Hypercholesterolemia   . Gouty arthritis   . Obesity   . 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 Coronary artery bypass graft 08/11/10    LIMA to LAD, SVG to 1st DX, SVG to OM1 and SVG to PD  . Cardiac catheterization 2007, 2011  . Shoulder surgery     RIGHT SHOULDER  . Knee arthroscopy 1982    RIGHT KNEE    Current Outpatient Prescriptions  Medication Sig Dispense Refill  . aspirin 81 MG tablet Take 81 mg by mouth 4 (four) times daily.       Marland Kitchen atenolol (TENORMIN) 50 MG tablet Take 1 tablet (50 mg total) by mouth daily.  90 tablet  3  . doxycycline (ADOXA) 50 MG tablet Take 50 mg by mouth 2 (two) times daily.      . fenofibrate 160 MG tablet Take 160 mg by mouth daily.        . fish oil-omega-3 fatty acids 1000 MG capsule Take  2 g by mouth daily.        Marland Kitchen gabapentin (NEURONTIN) 300 MG capsule Take 600 mg by mouth 3 (three) times daily.        Marland Kitchen levothyroxine (SYNTHROID, LEVOTHROID) 88 MCG tablet Take 88 mcg by mouth daily.        . Multiple Vitamins-Minerals (MEGA MULTI MEN PO) Take by mouth daily.        . nitroGLYCERIN (NITROSTAT) 0.4 MG SL tablet Place 0.4 mg under the tongue every 5 (five) minutes as needed.        Marland Kitchen omeprazole (PRILOSEC) 20 MG capsule Take 20 mg by mouth daily.      . rosuvastatin (CRESTOR) 5 MG tablet Take 5 mg by mouth 2 (two) times a week.        . traZODone (DESYREL) 50 MG tablet Take 50 mg by mouth at bedtime.      Marland Kitchen ZOLPIDEM TARTRATE PO Take by mouth at bedtime.          Allergies  Allergen Reactions  . Sulfa Drugs Cross Reactors     Review of Systems negative except from HPI and PMH  Physical Exam BP  131/80  Pulse 66  Ht 6' (1.829 m)  Wt 234 lb (106.142 kg)  BMI 31.74 kg/m2 Well developed and well nourished in no acute distress HENT normal E scleral and icterus clear Neck Supple JVP flat; carotids brisk and full Clear to ausculation Regular rate and rhythm, no murmurs gallops or rub Soft with active bowel sounds No clubbing cyanosis none Edema Alert and oriented, grossly normal motor and sensory function Skin Warm and Dry  Electrocardiogram demonstrates sinus rhythm at 61 Intervals 19/07/39 Old septal MI these represent loss of R waves compared to March  Assessment and  Plan

## 2012-02-25 NOTE — Discharge Instructions (Signed)

## 2012-02-25 NOTE — Telephone Encounter (Signed)
Order will be placed for event monitor for palps.  Pt will be contacted to schedule

## 2012-02-29 NOTE — Progress Notes (Signed)
Addended by: Reine Just on: 02/29/2012 05:10 PM   Modules accepted: Orders

## 2012-03-07 ENCOUNTER — Encounter: Payer: Self-pay | Admitting: Cardiology

## 2012-03-21 ENCOUNTER — Ambulatory Visit: Payer: Managed Care, Other (non HMO) | Admitting: Cardiology

## 2012-03-24 ENCOUNTER — Encounter (INDEPENDENT_AMBULATORY_CARE_PROVIDER_SITE_OTHER): Payer: Managed Care, Other (non HMO)

## 2012-03-24 DIAGNOSIS — R002 Palpitations: Secondary | ICD-10-CM

## 2012-04-25 ENCOUNTER — Telehealth: Payer: Self-pay | Admitting: Cardiology

## 2012-04-25 NOTE — Telephone Encounter (Signed)
New problem:  Patient calling for test results  

## 2012-04-25 NOTE — Telephone Encounter (Signed)
Pt aware that results are not available yet.  Will call once we have them.

## 2012-05-01 ENCOUNTER — Telehealth: Payer: Self-pay | Admitting: Cardiology

## 2012-05-01 ENCOUNTER — Telehealth: Payer: Self-pay

## 2012-05-01 NOTE — Telephone Encounter (Signed)
Spoke to Patient's wife about him calling the office to get results of montior and to make appointment

## 2012-05-01 NOTE — Telephone Encounter (Signed)
Called patient with results of montior and made appointment for 06/07/2012 @ 845 am

## 2012-05-04 ENCOUNTER — Telehealth: Payer: Self-pay

## 2012-05-04 NOTE — Telephone Encounter (Signed)
Patient aware of results of event montior

## 2012-06-07 ENCOUNTER — Ambulatory Visit (INDEPENDENT_AMBULATORY_CARE_PROVIDER_SITE_OTHER): Payer: Managed Care, Other (non HMO) | Admitting: Cardiology

## 2012-06-07 ENCOUNTER — Encounter: Payer: Self-pay | Admitting: Cardiology

## 2012-06-07 VITALS — BP 157/65 | HR 61 | Ht 72.0 in | Wt 237.8 lb

## 2012-06-07 DIAGNOSIS — I259 Chronic ischemic heart disease, unspecified: Secondary | ICD-10-CM

## 2012-06-07 DIAGNOSIS — I6529 Occlusion and stenosis of unspecified carotid artery: Secondary | ICD-10-CM

## 2012-06-07 DIAGNOSIS — E7800 Pure hypercholesterolemia, unspecified: Secondary | ICD-10-CM

## 2012-06-07 DIAGNOSIS — I10 Essential (primary) hypertension: Secondary | ICD-10-CM

## 2012-06-07 NOTE — Progress Notes (Signed)
HPI The patient presents for followup of coronary disease. I last saw him at the time of cardiac cath. At that time his bypass grafts were found to be patent and he had no obstructive coronary disease. Since then he has worn a monitor because of "spells."  He says he thinks the spells are related to anxiety. He says he is an anxious person. He occasionally takes Xanax and this helps. Monitor demonstrated no significant arrhythmias. He has also had carpal, surgery. The patient denies any new symptoms such as chest discomfort, neck or arm discomfort. There has been no new shortness of breath, PND or orthopnea. There have been no reported palpitations, presyncope or syncope.  Allergies  Allergen Reactions  . Sulfa Drugs Cross Reactors     Current Outpatient Prescriptions  Medication Sig Dispense Refill  . aspirin 81 MG tablet Take 81 mg by mouth 4 (four) times daily.       Marland Kitchen atenolol (TENORMIN) 50 MG tablet Take 1 tablet (50 mg total) by mouth daily.  90 tablet  3  . doxycycline (ADOXA) 50 MG tablet Take 50 mg by mouth 2 (two) times daily.      . fenofibrate 160 MG tablet Take 160 mg by mouth daily.        . fish oil-omega-3 fatty acids 1000 MG capsule Take 2 g by mouth daily.        Marland Kitchen gabapentin (NEURONTIN) 300 MG capsule Take 600 mg by mouth 3 (three) times daily.        Marland Kitchen levothyroxine (SYNTHROID, LEVOTHROID) 88 MCG tablet Take 88 mcg by mouth daily.        . Multiple Vitamins-Minerals (MEGA MULTI MEN PO) Take by mouth daily.        . nitroGLYCERIN (NITROSTAT) 0.4 MG SL tablet Place 0.4 mg under the tongue every 5 (five) minutes as needed. For chest pain.      Marland Kitchen omeprazole (PRILOSEC) 20 MG capsule Take 20 mg by mouth daily.      . rosuvastatin (CRESTOR) 5 MG tablet Take 5 mg by mouth 2 (two) times a week.        . traZODone (DESYREL) 50 MG tablet Take 50 mg by mouth at bedtime.      Marland Kitchen zolpidem (AMBIEN) 10 MG tablet Take 10 mg by mouth at bedtime.        Past Medical History  Diagnosis  Date  . Hypertension   . Seroma     RIGHT LEG  . Hearing loss   . Ischemic heart disease 1999    PREVIOUS STENTS IN THE LAD, AND RIGHT CORONARY ; S/P CABG in November 2011  . Hypercholesterolemia   . Gouty arthritis   . Obesity   . 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 Coronary artery bypass graft 08/11/10    LIMA to LAD, SVG to 1st DX, SVG to OM1 and SVG to PD  . Cardiac catheterization 2007, 2011  . Shoulder surgery     RIGHT SHOULDER  . Knee arthroscopy 1982    RIGHT KNEE    ROS:  As stated in the HPI and negative for all other systems.  PHYSICAL EXAM BP 157/65  Pulse 61  Ht 6' (1.829 m)  Wt 237 lb 12.8 oz (107.865 kg)  BMI 32.25 kg/m2 GENERAL:  Well appearing HEENT:  Pupils equal round and reactive, fundi not visualized, oral mucosa unremarkable NECK:  No jugular venous  distention, waveform within normal limits, carotid upstroke brisk and symmetric, no bruits, no thyromegaly LYMPHATICS:  No cervical, inguinal adenopathy LUNGS:  Clear to auscultation bilaterally BACK:  No CVA tenderness CHEST:  Well healed sternotomy scar. HEART:  PMI not displaced or sustained,S1 and S2 within normal limits, no S3, no S4, no clicks, no rubs, no murmurs ABD:  Flat, positive bowel sounds normal in frequency in pitch, no bruits, no rebound, no guarding, no midline pulsatile mass, no hepatomegaly, no splenomegaly EXT:  2 plus pulses throughout, no edema, no cyanosis no clubbing SKIN:  No rashes no nodules NEURO:  Cranial nerves II through XII grossly intact, motor grossly intact throughout PSYCH:  Cognitively intact, oriented to person place and time  ASSESSMENT AND PLAN  Ischemic heart disease -  He is status post  recent cath with patent grafts. He will continue with risk reduction.  Carotid stenosis -  He had some mild stenosis on the right before his bypass. He will get carotid Dopplers.  Hypercholesterolemia -  He has  been scheduled to come back for lipid profile with a target LDL less than 100 and HDL greater than 40   Hypertension - His blood pressure has been running but he says it is well controlled at home. No change in therapy is indicated.

## 2012-06-07 NOTE — Patient Instructions (Addendum)
The current medical regimen is effective;  continue present plan and medications.  Your physician has requested that you have a carotid duplex. This test is an ultrasound of the carotid arteries in your neck. It looks at blood flow through these arteries that supply the brain with blood. Allow one hour for this exam. There are no restrictions or special instructions.  Please return for a fasting lipid panel.  Follow up in 1 year with Dr Antoine Poche.  You will receive a letter in the mail 2 months before you are due.  Please call us when you receive this letter to schedule your follow up appointment.

## 2012-06-13 ENCOUNTER — Other Ambulatory Visit: Payer: Managed Care, Other (non HMO)

## 2012-06-19 ENCOUNTER — Encounter (INDEPENDENT_AMBULATORY_CARE_PROVIDER_SITE_OTHER): Payer: Managed Care, Other (non HMO)

## 2012-06-19 ENCOUNTER — Other Ambulatory Visit (INDEPENDENT_AMBULATORY_CARE_PROVIDER_SITE_OTHER): Payer: Managed Care, Other (non HMO)

## 2012-06-19 DIAGNOSIS — I6529 Occlusion and stenosis of unspecified carotid artery: Secondary | ICD-10-CM

## 2012-06-19 DIAGNOSIS — E7800 Pure hypercholesterolemia, unspecified: Secondary | ICD-10-CM

## 2012-06-19 LAB — LIPID PANEL
Cholesterol: 155 mg/dL (ref 0–200)
HDL: 30.3 mg/dL — ABNORMAL LOW (ref >39.00)
LDL Cholesterol: 85 mg/dL (ref 0–99)
VLDL: 39.4 mg/dL (ref 0.0–40.0)

## 2012-08-12 ENCOUNTER — Other Ambulatory Visit: Payer: Self-pay | Admitting: Nurse Practitioner

## 2012-10-19 ENCOUNTER — Other Ambulatory Visit: Payer: Self-pay | Admitting: Cardiology

## 2012-10-20 ENCOUNTER — Other Ambulatory Visit: Payer: Self-pay | Admitting: Cardiology

## 2012-11-20 ENCOUNTER — Telehealth: Payer: Self-pay | Admitting: Cardiology

## 2012-11-20 MED ORDER — ATENOLOL 50 MG PO TABS: ORAL_TABLET | ORAL | Status: AC

## 2012-11-20 NOTE — Telephone Encounter (Signed)
Needs new RX for atenolol send into pharamcy since he has a new insurance company

## 2012-11-20 NOTE — Telephone Encounter (Signed)
New Prob     Requesting generic form of atenolol. Also would like it sent to mail order 90 day supply.

## 2013-02-15 ENCOUNTER — Other Ambulatory Visit: Payer: Self-pay | Admitting: *Deleted

## 2013-02-15 ENCOUNTER — Other Ambulatory Visit: Payer: Managed Care, Other (non HMO)

## 2013-02-15 DIAGNOSIS — M1990 Unspecified osteoarthritis, unspecified site: Secondary | ICD-10-CM

## 2013-02-15 DIAGNOSIS — T7589XA Other specified effects of external causes, initial encounter: Secondary | ICD-10-CM

## 2013-02-21 ENCOUNTER — Ambulatory Visit
Admission: RE | Admit: 2013-02-21 | Discharge: 2013-02-21 | Disposition: A | Discharge status: Home / Self Care | Payer: Non-veteran care | Source: Ambulatory Visit | Attending: *Deleted | Admitting: *Deleted

## 2013-02-21 DIAGNOSIS — T7589XA Other specified effects of external causes, initial encounter: Secondary | ICD-10-CM

## 2013-02-21 DIAGNOSIS — M1990 Unspecified osteoarthritis, unspecified site: Secondary | ICD-10-CM

## 2013-02-21 MED ORDER — GADOBENATE DIMEGLUMINE 529 MG/ML IV SOLN
20.0000 mL | Freq: Once | INTRAVENOUS | Status: AC | PRN
  Administered 2013-02-21: 20 mL via INTRAVENOUS

## 2013-03-14 ENCOUNTER — Other Ambulatory Visit: Payer: Self-pay

## 2013-03-14 MED ORDER — NITROGLYCERIN 0.4 MG SL SUBL: SUBLINGUAL_TABLET | SUBLINGUAL | Status: AC

## 2013-06-26 ENCOUNTER — Ambulatory Visit (INDEPENDENT_AMBULATORY_CARE_PROVIDER_SITE_OTHER): Payer: Medicare Other | Admitting: Cardiology

## 2013-06-26 ENCOUNTER — Encounter: Payer: Self-pay | Admitting: Cardiology

## 2013-06-26 VITALS — BP 120/64 | HR 57 | Ht 72.0 in | Wt 237.0 lb

## 2013-06-26 DIAGNOSIS — I259 Chronic ischemic heart disease, unspecified: Secondary | ICD-10-CM

## 2013-06-26 NOTE — Patient Instructions (Addendum)
The current medical regimen is effective;  continue present plan and medications.  Follow up in 1 year with Dr Hochrein.  You will receive a letter in the mail 2 months before you are due.  Please call us when you receive this letter to schedule your follow up appointment.  

## 2013-06-26 NOTE — Progress Notes (Signed)
HPI The patient presents for followup of coronary disease.   Since I last saw him he has had no further problems. He is going to have arthroscopic left shoulder surgery. He needs preoperative clearance. 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  The patient denies any new symptoms such as chest discomfort, neck or arm discomfort. There has been no new shortness of breath, PND or orthopnea. There have been no reported palpitations, presyncope or syncope.     Allergies  Allergen Reactions  . Sulfa Drugs Cross Reactors     Current Outpatient Prescriptions  Medication Sig Dispense Refill  . aspirin 81 MG tablet Take 81 mg by mouth 4 (four) times daily.       Marland Kitchen atenolol (TENORMIN) 50 MG tablet TAKE 1 TABLET DAILY  90 tablet  3  . fenofibrate 160 MG tablet Take 160 mg by mouth daily.        Marland Kitchen levothyroxine (SYNTHROID, LEVOTHROID) 88 MCG tablet Take 88 mcg by mouth daily.        . Multiple Vitamins-Minerals (MEGA MULTI MEN PO) Take by mouth daily.        . nitroGLYCERIN (NITROSTAT) 0.4 MG SL tablet PLACE 1 TABLET UNDER THE TONGUE EVERY 5 MINUTES UP TO 3 DOSES AS NEEDED FOR CHEST PAIN.  25 tablet  1  . omeprazole (PRILOSEC) 20 MG capsule Take 20 mg by mouth daily.      . traZODone (DESYREL) 50 MG tablet Take 50 mg by mouth at bedtime.      Marland Kitchen zolpidem (AMBIEN) 10 MG tablet Take 10 mg by mouth at bedtime.      . rosuvastatin (CRESTOR) 5 MG tablet Take 5 mg by mouth 2 (two) times a week.         No current facility-administered medications for this visit.    Past Medical History  Diagnosis Date  . Hypertension   . Seroma     RIGHT LEG  . Hearing loss   . Ischemic heart disease 1999    PREVIOUS STENTS IN THE LAD, AND RIGHT CORONARY ; S/P CABG in November 2011  . Hypercholesterolemia   . Gouty arthritis   . Obesity   . 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 Coronary artery bypass graft  08/11/10    LIMA to LAD, SVG to 1st DX, SVG to OM1 and SVG to PD  . Cardiac catheterization  2007, 2011  . Shoulder surgery      RIGHT SHOULDER  . Knee arthroscopy  1982    RIGHT KNEE    ROS:  Arthritis.  Otherwise as stated in the HPI and negative for all other systems.  PHYSICAL EXAM BP 120/64  Pulse 57  Ht 6' (1.829 m)  Wt 237 lb (107.502 kg)  BMI 32.14 kg/m2 GENERAL:  Well appearing NECK:  No jugular venous distention, waveform within normal limits, carotid upstroke brisk and symmetric, no bruits, no thyromegaly LUNGS:  Clear to auscultation bilaterally CHEST:  Well healed sternotomy scar. HEART:  PMI not displaced or sustained,S1 and S2 within normal limits, no S3, no S4, no clicks, no rubs, no murmurs ABD:  Flat, positive bowel sounds normal in frequency in pitch, no bruits, no rebound, no guarding, no midline pulsatile mass, no hepatomegaly, no splenomegaly EXT:  2 plus pulses throughout, no edema, no cyanosis  no clubbing  Sinus rhythm, rate 57, axis within normal limits, intervals within normal limits, no acute ST-T wave changes.  ASSESSMENT AND PLAN  Ischemic heart disease -  The patient has no new sypmtoms.  No further cardiovascular testing is indicated.  We will continue with aggressive risk reduction and meds as listed.  Carotid stenosis -  This was very mild.  No further imaging is indicated at this point.   Hypercholesterolemia -  This is  Followed by the [place].  Hypertension - The blood pressure is at target. No change in medications is indicated. We will continue with therapeutic lifestyle changes (TLC).  Preop - The patient has no high-risk findings since his catheterization. He has a very high functional level. He is going for surgery that is only moderate risk from a cardiovascular standpoint. Therefore, based on ACC/AHA guidelines, the patient would be at acceptable risk for the planned  procedure without further cardiovascular testing.

## 2014-05-17 ENCOUNTER — Other Ambulatory Visit: Payer: Self-pay | Admitting: Family Medicine

## 2014-05-17 DIAGNOSIS — M25512 Pain in left shoulder: Secondary | ICD-10-CM

## 2014-05-31 ENCOUNTER — Other Ambulatory Visit: Payer: Self-pay | Admitting: Family Medicine

## 2014-05-31 DIAGNOSIS — Z139 Encounter for screening, unspecified: Secondary | ICD-10-CM

## 2014-06-03 ENCOUNTER — Ambulatory Visit
Admission: RE | Admit: 2014-06-03 | Discharge: 2014-06-03 | Disposition: A | Discharge status: Home / Self Care | Payer: Non-veteran care | Source: Ambulatory Visit | Attending: Family Medicine | Admitting: Family Medicine

## 2014-06-03 DIAGNOSIS — Z139 Encounter for screening, unspecified: Secondary | ICD-10-CM

## 2014-06-03 DIAGNOSIS — M25512 Pain in left shoulder: Secondary | ICD-10-CM

## 2014-06-07 ENCOUNTER — Other Ambulatory Visit: Payer: Non-veteran care

## 2014-08-29 ENCOUNTER — Encounter (HOSPITAL_COMMUNITY): Payer: Self-pay | Admitting: Cardiology

## 2015-11-25 DIAGNOSIS — M5417 Radiculopathy, lumbosacral region: Secondary | ICD-10-CM | POA: Diagnosis not present

## 2015-11-25 DIAGNOSIS — M9903 Segmental and somatic dysfunction of lumbar region: Secondary | ICD-10-CM | POA: Diagnosis not present

## 2015-12-01 DIAGNOSIS — M9903 Segmental and somatic dysfunction of lumbar region: Secondary | ICD-10-CM | POA: Diagnosis not present

## 2015-12-01 DIAGNOSIS — M5417 Radiculopathy, lumbosacral region: Secondary | ICD-10-CM | POA: Diagnosis not present

## 2016-01-11 DIAGNOSIS — M545 Low back pain: Secondary | ICD-10-CM | POA: Diagnosis not present

## 2016-03-09 DIAGNOSIS — M9903 Segmental and somatic dysfunction of lumbar region: Secondary | ICD-10-CM | POA: Diagnosis not present

## 2016-03-09 DIAGNOSIS — M5417 Radiculopathy, lumbosacral region: Secondary | ICD-10-CM | POA: Diagnosis not present

## 2016-03-11 DIAGNOSIS — M9903 Segmental and somatic dysfunction of lumbar region: Secondary | ICD-10-CM | POA: Diagnosis not present

## 2016-03-11 DIAGNOSIS — M5417 Radiculopathy, lumbosacral region: Secondary | ICD-10-CM | POA: Diagnosis not present

## 2016-03-18 DIAGNOSIS — M9903 Segmental and somatic dysfunction of lumbar region: Secondary | ICD-10-CM | POA: Diagnosis not present

## 2016-03-18 DIAGNOSIS — M5417 Radiculopathy, lumbosacral region: Secondary | ICD-10-CM | POA: Diagnosis not present

## 2016-04-06 DIAGNOSIS — M9903 Segmental and somatic dysfunction of lumbar region: Secondary | ICD-10-CM | POA: Diagnosis not present

## 2016-04-06 DIAGNOSIS — M5417 Radiculopathy, lumbosacral region: Secondary | ICD-10-CM | POA: Diagnosis not present

## 2016-05-03 DIAGNOSIS — M9903 Segmental and somatic dysfunction of lumbar region: Secondary | ICD-10-CM | POA: Diagnosis not present

## 2016-05-03 DIAGNOSIS — M5417 Radiculopathy, lumbosacral region: Secondary | ICD-10-CM | POA: Diagnosis not present

## 2016-07-04 DIAGNOSIS — J0100 Acute maxillary sinusitis, unspecified: Secondary | ICD-10-CM | POA: Diagnosis not present

## 2016-09-04 DIAGNOSIS — J309 Allergic rhinitis, unspecified: Secondary | ICD-10-CM | POA: Diagnosis not present

## 2016-09-04 DIAGNOSIS — J0100 Acute maxillary sinusitis, unspecified: Secondary | ICD-10-CM | POA: Diagnosis not present

## 2017-03-28 DIAGNOSIS — J329 Chronic sinusitis, unspecified: Secondary | ICD-10-CM | POA: Diagnosis not present

## 2017-03-28 DIAGNOSIS — S50812A Abrasion of left forearm, initial encounter: Secondary | ICD-10-CM | POA: Diagnosis not present

## 2017-03-28 DIAGNOSIS — E291 Testicular hypofunction: Secondary | ICD-10-CM | POA: Diagnosis not present

## 2017-04-28 DIAGNOSIS — J0100 Acute maxillary sinusitis, unspecified: Secondary | ICD-10-CM | POA: Diagnosis not present

## 2017-04-28 DIAGNOSIS — J301 Allergic rhinitis due to pollen: Secondary | ICD-10-CM | POA: Diagnosis not present

## 2017-08-15 DIAGNOSIS — M546 Pain in thoracic spine: Secondary | ICD-10-CM | POA: Diagnosis not present

## 2017-10-01 DIAGNOSIS — J0100 Acute maxillary sinusitis, unspecified: Secondary | ICD-10-CM | POA: Diagnosis not present

## 2017-10-15 DIAGNOSIS — J111 Influenza due to unidentified influenza virus with other respiratory manifestations: Secondary | ICD-10-CM | POA: Diagnosis not present

## 2017-10-18 DIAGNOSIS — R062 Wheezing: Secondary | ICD-10-CM | POA: Diagnosis not present

## 2017-10-18 DIAGNOSIS — Z09 Encounter for follow-up examination after completed treatment for conditions other than malignant neoplasm: Secondary | ICD-10-CM | POA: Diagnosis not present

## 2017-10-24 DIAGNOSIS — Z01818 Encounter for other preprocedural examination: Secondary | ICD-10-CM

## 2017-10-31 DIAGNOSIS — Z01818 Encounter for other preprocedural examination: Secondary | ICD-10-CM | POA: Diagnosis not present

## 2017-12-08 DIAGNOSIS — J0100 Acute maxillary sinusitis, unspecified: Secondary | ICD-10-CM | POA: Diagnosis not present

## 2018-01-26 DIAGNOSIS — M545 Low back pain: Secondary | ICD-10-CM | POA: Diagnosis not present

## 2018-01-26 DIAGNOSIS — Z0000 Encounter for general adult medical examination without abnormal findings: Secondary | ICD-10-CM | POA: Diagnosis not present

## 2018-01-26 DIAGNOSIS — N410 Acute prostatitis: Secondary | ICD-10-CM | POA: Diagnosis not present

## 2018-01-26 DIAGNOSIS — Z298 Encounter for other specified prophylactic measures: Secondary | ICD-10-CM | POA: Diagnosis not present

## 2018-06-27 ENCOUNTER — Encounter: Payer: Self-pay | Admitting: Gastroenterology

## 2018-07-27 DIAGNOSIS — R0981 Nasal congestion: Secondary | ICD-10-CM | POA: Diagnosis not present

## 2018-07-27 DIAGNOSIS — M5440 Lumbago with sciatica, unspecified side: Secondary | ICD-10-CM | POA: Diagnosis not present

## 2018-08-02 DIAGNOSIS — J069 Acute upper respiratory infection, unspecified: Secondary | ICD-10-CM | POA: Diagnosis not present

## 2018-08-02 DIAGNOSIS — J309 Allergic rhinitis, unspecified: Secondary | ICD-10-CM | POA: Diagnosis not present

## 2018-09-15 DIAGNOSIS — J0100 Acute maxillary sinusitis, unspecified: Secondary | ICD-10-CM | POA: Diagnosis not present

## 2018-09-15 DIAGNOSIS — Z87891 Personal history of nicotine dependence: Secondary | ICD-10-CM | POA: Diagnosis not present

## 2019-02-26 DIAGNOSIS — L821 Other seborrheic keratosis: Secondary | ICD-10-CM | POA: Diagnosis not present

## 2019-02-26 DIAGNOSIS — L578 Other skin changes due to chronic exposure to nonionizing radiation: Secondary | ICD-10-CM | POA: Diagnosis not present

## 2019-02-26 DIAGNOSIS — Z85820 Personal history of malignant melanoma of skin: Secondary | ICD-10-CM | POA: Diagnosis not present

## 2019-02-26 DIAGNOSIS — B351 Tinea unguium: Secondary | ICD-10-CM | POA: Diagnosis not present

## 2019-02-26 DIAGNOSIS — L570 Actinic keratosis: Secondary | ICD-10-CM | POA: Diagnosis not present

## 2019-04-24 DIAGNOSIS — M5136 Other intervertebral disc degeneration, lumbar region: Secondary | ICD-10-CM | POA: Diagnosis not present

## 2019-04-24 DIAGNOSIS — M9904 Segmental and somatic dysfunction of sacral region: Secondary | ICD-10-CM | POA: Diagnosis not present

## 2019-04-24 DIAGNOSIS — M9905 Segmental and somatic dysfunction of pelvic region: Secondary | ICD-10-CM | POA: Diagnosis not present

## 2019-04-24 DIAGNOSIS — M50323 Other cervical disc degeneration at C6-C7 level: Secondary | ICD-10-CM | POA: Diagnosis not present

## 2019-04-24 DIAGNOSIS — M50322 Other cervical disc degeneration at C5-C6 level: Secondary | ICD-10-CM | POA: Diagnosis not present

## 2019-04-24 DIAGNOSIS — M9901 Segmental and somatic dysfunction of cervical region: Secondary | ICD-10-CM | POA: Diagnosis not present

## 2019-04-24 DIAGNOSIS — M9903 Segmental and somatic dysfunction of lumbar region: Secondary | ICD-10-CM | POA: Diagnosis not present

## 2019-04-24 DIAGNOSIS — Q72891 Other reduction defects of right lower limb: Secondary | ICD-10-CM | POA: Diagnosis not present

## 2019-04-25 DIAGNOSIS — M50322 Other cervical disc degeneration at C5-C6 level: Secondary | ICD-10-CM | POA: Diagnosis not present

## 2019-04-25 DIAGNOSIS — M9901 Segmental and somatic dysfunction of cervical region: Secondary | ICD-10-CM | POA: Diagnosis not present

## 2019-04-25 DIAGNOSIS — M9905 Segmental and somatic dysfunction of pelvic region: Secondary | ICD-10-CM | POA: Diagnosis not present

## 2019-04-25 DIAGNOSIS — M9903 Segmental and somatic dysfunction of lumbar region: Secondary | ICD-10-CM | POA: Diagnosis not present

## 2019-04-25 DIAGNOSIS — M9904 Segmental and somatic dysfunction of sacral region: Secondary | ICD-10-CM | POA: Diagnosis not present

## 2019-04-25 DIAGNOSIS — M5136 Other intervertebral disc degeneration, lumbar region: Secondary | ICD-10-CM | POA: Diagnosis not present

## 2019-04-25 DIAGNOSIS — M50323 Other cervical disc degeneration at C6-C7 level: Secondary | ICD-10-CM | POA: Diagnosis not present

## 2019-04-25 DIAGNOSIS — Q72891 Other reduction defects of right lower limb: Secondary | ICD-10-CM | POA: Diagnosis not present

## 2019-04-26 DIAGNOSIS — M9901 Segmental and somatic dysfunction of cervical region: Secondary | ICD-10-CM | POA: Diagnosis not present

## 2019-04-26 DIAGNOSIS — M9904 Segmental and somatic dysfunction of sacral region: Secondary | ICD-10-CM | POA: Diagnosis not present

## 2019-04-26 DIAGNOSIS — M9905 Segmental and somatic dysfunction of pelvic region: Secondary | ICD-10-CM | POA: Diagnosis not present

## 2019-04-26 DIAGNOSIS — M5136 Other intervertebral disc degeneration, lumbar region: Secondary | ICD-10-CM | POA: Diagnosis not present

## 2019-04-26 DIAGNOSIS — M50322 Other cervical disc degeneration at C5-C6 level: Secondary | ICD-10-CM | POA: Diagnosis not present

## 2019-04-26 DIAGNOSIS — M50323 Other cervical disc degeneration at C6-C7 level: Secondary | ICD-10-CM | POA: Diagnosis not present

## 2019-04-26 DIAGNOSIS — Q72891 Other reduction defects of right lower limb: Secondary | ICD-10-CM | POA: Diagnosis not present

## 2019-04-26 DIAGNOSIS — M9903 Segmental and somatic dysfunction of lumbar region: Secondary | ICD-10-CM | POA: Diagnosis not present

## 2019-04-30 DIAGNOSIS — M9901 Segmental and somatic dysfunction of cervical region: Secondary | ICD-10-CM | POA: Diagnosis not present

## 2019-04-30 DIAGNOSIS — Q72891 Other reduction defects of right lower limb: Secondary | ICD-10-CM | POA: Diagnosis not present

## 2019-04-30 DIAGNOSIS — M50323 Other cervical disc degeneration at C6-C7 level: Secondary | ICD-10-CM | POA: Diagnosis not present

## 2019-04-30 DIAGNOSIS — M9904 Segmental and somatic dysfunction of sacral region: Secondary | ICD-10-CM | POA: Diagnosis not present

## 2019-04-30 DIAGNOSIS — M50322 Other cervical disc degeneration at C5-C6 level: Secondary | ICD-10-CM | POA: Diagnosis not present

## 2019-04-30 DIAGNOSIS — M9903 Segmental and somatic dysfunction of lumbar region: Secondary | ICD-10-CM | POA: Diagnosis not present

## 2019-04-30 DIAGNOSIS — M5136 Other intervertebral disc degeneration, lumbar region: Secondary | ICD-10-CM | POA: Diagnosis not present

## 2019-04-30 DIAGNOSIS — M9905 Segmental and somatic dysfunction of pelvic region: Secondary | ICD-10-CM | POA: Diagnosis not present

## 2019-05-02 DIAGNOSIS — M9901 Segmental and somatic dysfunction of cervical region: Secondary | ICD-10-CM | POA: Diagnosis not present

## 2019-05-02 DIAGNOSIS — M9904 Segmental and somatic dysfunction of sacral region: Secondary | ICD-10-CM | POA: Diagnosis not present

## 2019-05-02 DIAGNOSIS — M9903 Segmental and somatic dysfunction of lumbar region: Secondary | ICD-10-CM | POA: Diagnosis not present

## 2019-05-02 DIAGNOSIS — M5136 Other intervertebral disc degeneration, lumbar region: Secondary | ICD-10-CM | POA: Diagnosis not present

## 2019-05-02 DIAGNOSIS — Q72891 Other reduction defects of right lower limb: Secondary | ICD-10-CM | POA: Diagnosis not present

## 2019-05-02 DIAGNOSIS — M50322 Other cervical disc degeneration at C5-C6 level: Secondary | ICD-10-CM | POA: Diagnosis not present

## 2019-05-02 DIAGNOSIS — M9905 Segmental and somatic dysfunction of pelvic region: Secondary | ICD-10-CM | POA: Diagnosis not present

## 2019-05-02 DIAGNOSIS — M50323 Other cervical disc degeneration at C6-C7 level: Secondary | ICD-10-CM | POA: Diagnosis not present

## 2019-05-04 DIAGNOSIS — Q72891 Other reduction defects of right lower limb: Secondary | ICD-10-CM | POA: Diagnosis not present

## 2019-05-04 DIAGNOSIS — M9905 Segmental and somatic dysfunction of pelvic region: Secondary | ICD-10-CM | POA: Diagnosis not present

## 2019-05-04 DIAGNOSIS — M5136 Other intervertebral disc degeneration, lumbar region: Secondary | ICD-10-CM | POA: Diagnosis not present

## 2019-05-04 DIAGNOSIS — M9901 Segmental and somatic dysfunction of cervical region: Secondary | ICD-10-CM | POA: Diagnosis not present

## 2019-05-04 DIAGNOSIS — M50322 Other cervical disc degeneration at C5-C6 level: Secondary | ICD-10-CM | POA: Diagnosis not present

## 2019-05-04 DIAGNOSIS — M9903 Segmental and somatic dysfunction of lumbar region: Secondary | ICD-10-CM | POA: Diagnosis not present

## 2019-05-04 DIAGNOSIS — M50323 Other cervical disc degeneration at C6-C7 level: Secondary | ICD-10-CM | POA: Diagnosis not present

## 2019-05-04 DIAGNOSIS — M9904 Segmental and somatic dysfunction of sacral region: Secondary | ICD-10-CM | POA: Diagnosis not present

## 2019-08-30 DIAGNOSIS — Z85820 Personal history of malignant melanoma of skin: Secondary | ICD-10-CM | POA: Diagnosis not present

## 2019-08-30 DIAGNOSIS — L570 Actinic keratosis: Secondary | ICD-10-CM | POA: Diagnosis not present

## 2019-08-30 DIAGNOSIS — D225 Melanocytic nevi of trunk: Secondary | ICD-10-CM | POA: Diagnosis not present

## 2019-08-30 DIAGNOSIS — L821 Other seborrheic keratosis: Secondary | ICD-10-CM | POA: Diagnosis not present

## 2019-11-02 DIAGNOSIS — L578 Other skin changes due to chronic exposure to nonionizing radiation: Secondary | ICD-10-CM | POA: Diagnosis not present

## 2019-11-02 DIAGNOSIS — L821 Other seborrheic keratosis: Secondary | ICD-10-CM | POA: Diagnosis not present

## 2019-11-02 DIAGNOSIS — L570 Actinic keratosis: Secondary | ICD-10-CM | POA: Diagnosis not present

## 2019-11-02 DIAGNOSIS — L820 Inflamed seborrheic keratosis: Secondary | ICD-10-CM | POA: Diagnosis not present

## 2019-12-05 DIAGNOSIS — J310 Chronic rhinitis: Secondary | ICD-10-CM | POA: Diagnosis not present

## 2019-12-05 DIAGNOSIS — J329 Chronic sinusitis, unspecified: Secondary | ICD-10-CM | POA: Diagnosis not present

## 2020-01-01 DIAGNOSIS — R0789 Other chest pain: Secondary | ICD-10-CM | POA: Diagnosis not present

## 2020-01-01 DIAGNOSIS — R918 Other nonspecific abnormal finding of lung field: Secondary | ICD-10-CM | POA: Diagnosis not present

## 2020-01-01 DIAGNOSIS — Z0000 Encounter for general adult medical examination without abnormal findings: Secondary | ICD-10-CM | POA: Diagnosis not present

## 2020-01-14 DIAGNOSIS — Z20822 Contact with and (suspected) exposure to covid-19: Secondary | ICD-10-CM | POA: Diagnosis not present

## 2020-01-14 DIAGNOSIS — Z01812 Encounter for preprocedural laboratory examination: Secondary | ICD-10-CM | POA: Diagnosis not present

## 2020-05-23 DIAGNOSIS — M545 Low back pain: Secondary | ICD-10-CM | POA: Diagnosis not present

## 2020-05-23 DIAGNOSIS — M9905 Segmental and somatic dysfunction of pelvic region: Secondary | ICD-10-CM | POA: Diagnosis not present

## 2020-05-23 DIAGNOSIS — M9902 Segmental and somatic dysfunction of thoracic region: Secondary | ICD-10-CM | POA: Diagnosis not present

## 2020-05-23 DIAGNOSIS — M9901 Segmental and somatic dysfunction of cervical region: Secondary | ICD-10-CM | POA: Diagnosis not present

## 2020-05-30 DIAGNOSIS — M9901 Segmental and somatic dysfunction of cervical region: Secondary | ICD-10-CM | POA: Diagnosis not present

## 2020-05-30 DIAGNOSIS — M545 Low back pain: Secondary | ICD-10-CM | POA: Diagnosis not present

## 2020-05-30 DIAGNOSIS — M9905 Segmental and somatic dysfunction of pelvic region: Secondary | ICD-10-CM | POA: Diagnosis not present

## 2020-05-30 DIAGNOSIS — M9902 Segmental and somatic dysfunction of thoracic region: Secondary | ICD-10-CM | POA: Diagnosis not present

## 2020-06-06 DIAGNOSIS — M545 Low back pain: Secondary | ICD-10-CM | POA: Diagnosis not present

## 2020-06-06 DIAGNOSIS — M9902 Segmental and somatic dysfunction of thoracic region: Secondary | ICD-10-CM | POA: Diagnosis not present

## 2020-06-06 DIAGNOSIS — M9905 Segmental and somatic dysfunction of pelvic region: Secondary | ICD-10-CM | POA: Diagnosis not present

## 2020-06-06 DIAGNOSIS — M9901 Segmental and somatic dysfunction of cervical region: Secondary | ICD-10-CM | POA: Diagnosis not present

## 2020-06-27 DIAGNOSIS — M9902 Segmental and somatic dysfunction of thoracic region: Secondary | ICD-10-CM | POA: Diagnosis not present

## 2020-06-27 DIAGNOSIS — M545 Low back pain, unspecified: Secondary | ICD-10-CM | POA: Diagnosis not present

## 2020-06-27 DIAGNOSIS — M9905 Segmental and somatic dysfunction of pelvic region: Secondary | ICD-10-CM | POA: Diagnosis not present

## 2020-06-27 DIAGNOSIS — M9901 Segmental and somatic dysfunction of cervical region: Secondary | ICD-10-CM | POA: Diagnosis not present

## 2020-08-04 DIAGNOSIS — M1812 Unilateral primary osteoarthritis of first carpometacarpal joint, left hand: Secondary | ICD-10-CM | POA: Diagnosis not present

## 2020-08-29 DIAGNOSIS — L570 Actinic keratosis: Secondary | ICD-10-CM | POA: Diagnosis not present

## 2020-08-29 DIAGNOSIS — L578 Other skin changes due to chronic exposure to nonionizing radiation: Secondary | ICD-10-CM | POA: Diagnosis not present

## 2020-08-29 DIAGNOSIS — Z85820 Personal history of malignant melanoma of skin: Secondary | ICD-10-CM | POA: Diagnosis not present

## 2020-08-29 DIAGNOSIS — L821 Other seborrheic keratosis: Secondary | ICD-10-CM | POA: Diagnosis not present

## 2021-01-13 DIAGNOSIS — Z85820 Personal history of malignant melanoma of skin: Secondary | ICD-10-CM | POA: Diagnosis not present

## 2021-01-13 DIAGNOSIS — L300 Nummular dermatitis: Secondary | ICD-10-CM | POA: Diagnosis not present

## 2021-01-29 DIAGNOSIS — J0100 Acute maxillary sinusitis, unspecified: Secondary | ICD-10-CM | POA: Diagnosis not present

## 2021-03-07 DIAGNOSIS — R21 Rash and other nonspecific skin eruption: Secondary | ICD-10-CM | POA: Diagnosis not present

## 2021-03-07 DIAGNOSIS — L255 Unspecified contact dermatitis due to plants, except food: Secondary | ICD-10-CM | POA: Diagnosis not present

## 2021-04-22 DIAGNOSIS — R0981 Nasal congestion: Secondary | ICD-10-CM | POA: Diagnosis not present

## 2021-04-22 DIAGNOSIS — Z20828 Contact with and (suspected) exposure to other viral communicable diseases: Secondary | ICD-10-CM | POA: Diagnosis not present

## 2021-04-22 DIAGNOSIS — R051 Acute cough: Secondary | ICD-10-CM | POA: Diagnosis not present

## 2021-07-31 DIAGNOSIS — Z23 Encounter for immunization: Secondary | ICD-10-CM | POA: Diagnosis not present

## 2023-08-28 ENCOUNTER — Other Ambulatory Visit: Payer: Self-pay

## 2023-08-28 ENCOUNTER — Emergency Department (HOSPITAL_COMMUNITY)
Admission: EM | Admit: 2023-08-28 | Discharge: 2023-08-28 | Disposition: A | Discharge status: Home / Self Care | Payer: No Typology Code available for payment source | Attending: Emergency Medicine | Admitting: Emergency Medicine

## 2023-08-28 ENCOUNTER — Emergency Department (HOSPITAL_COMMUNITY): Payer: No Typology Code available for payment source

## 2023-08-28 ENCOUNTER — Encounter (HOSPITAL_COMMUNITY): Payer: Self-pay | Admitting: Emergency Medicine

## 2023-08-28 DIAGNOSIS — R059 Cough, unspecified: Secondary | ICD-10-CM | POA: Insufficient documentation

## 2023-08-28 DIAGNOSIS — R5383 Other fatigue: Secondary | ICD-10-CM | POA: Diagnosis not present

## 2023-08-28 DIAGNOSIS — R0689 Other abnormalities of breathing: Secondary | ICD-10-CM | POA: Diagnosis not present

## 2023-08-28 DIAGNOSIS — R509 Fever, unspecified: Secondary | ICD-10-CM | POA: Diagnosis not present

## 2023-08-28 DIAGNOSIS — Z7982 Long term (current) use of aspirin: Secondary | ICD-10-CM | POA: Diagnosis not present

## 2023-08-28 DIAGNOSIS — R531 Weakness: Secondary | ICD-10-CM | POA: Diagnosis not present

## 2023-08-28 DIAGNOSIS — J189 Pneumonia, unspecified organism: Secondary | ICD-10-CM

## 2023-08-28 DIAGNOSIS — Z20822 Contact with and (suspected) exposure to covid-19: Secondary | ICD-10-CM | POA: Diagnosis not present

## 2023-08-28 LAB — COMPREHENSIVE METABOLIC PANEL
ALT: 19 U/L (ref 0–44)
AST: 30 U/L (ref 15–41)
Albumin: 4.1 g/dL (ref 3.5–5.0)
Alkaline Phosphatase: 37 U/L — ABNORMAL LOW (ref 38–126)
Anion gap: 11 (ref 5–15)
BUN: 17 mg/dL (ref 8–23)
CO2: 22 mmol/L (ref 22–32)
Calcium: 9.2 mg/dL (ref 8.9–10.3)
Chloride: 96 mmol/L — ABNORMAL LOW (ref 98–111)
Creatinine, Ser: 1.62 mg/dL — ABNORMAL HIGH (ref 0.61–1.24)
GFR, Estimated: 44 mL/min — ABNORMAL LOW (ref >60)
Glucose, Bld: 123 mg/dL — ABNORMAL HIGH (ref 70–99)
Potassium: 3.6 mmol/L (ref 3.5–5.1)
Sodium: 129 mmol/L — ABNORMAL LOW (ref 135–145)
Total Bilirubin: 0.8 mg/dL (ref <1.2)
Total Protein: 7.6 g/dL (ref 6.5–8.1)

## 2023-08-28 LAB — URINALYSIS, W/ REFLEX TO CULTURE (INFECTION SUSPECTED)
Bilirubin Urine: NEGATIVE
Glucose, UA: NEGATIVE mg/dL
Hgb urine dipstick: NEGATIVE
Ketones, ur: NEGATIVE mg/dL
Leukocytes,Ua: NEGATIVE
Nitrite: NEGATIVE
Protein, ur: NEGATIVE mg/dL
Specific Gravity, Urine: 1.015 (ref 1.005–1.030)
pH: 7 (ref 5.0–8.0)

## 2023-08-28 LAB — CBC WITH DIFFERENTIAL/PLATELET
Abs Immature Granulocytes: 0.56 10*3/uL — ABNORMAL HIGH (ref 0.00–0.07)
Basophils Absolute: 0 10*3/uL (ref 0.0–0.1)
Basophils Relative: 1 %
Eosinophils Absolute: 0 10*3/uL (ref 0.0–0.5)
Eosinophils Relative: 0 %
HCT: 37.7 % — ABNORMAL LOW (ref 39.0–52.0)
Hemoglobin: 12.9 g/dL — ABNORMAL LOW (ref 13.0–17.0)
Immature Granulocytes: 10 %
Lymphocytes Relative: 13 %
Lymphs Abs: 0.7 10*3/uL (ref 0.7–4.0)
MCH: 30.9 pg (ref 26.0–34.0)
MCHC: 34.2 g/dL (ref 30.0–36.0)
MCV: 90.4 fL (ref 80.0–100.0)
Monocytes Absolute: 0.7 10*3/uL (ref 0.1–1.0)
Monocytes Relative: 13 %
Neutro Abs: 3.5 10*3/uL (ref 1.7–7.7)
Neutrophils Relative %: 63 %
Platelets: 195 10*3/uL (ref 150–400)
RBC: 4.17 MIL/uL — ABNORMAL LOW (ref 4.22–5.81)
RDW: 13.2 % (ref 11.5–15.5)
Smear Review: NORMAL
WBC: 5.5 10*3/uL (ref 4.0–10.5)
nRBC: 0 % (ref 0.0–0.2)

## 2023-08-28 LAB — RESP PANEL BY RT-PCR (RSV, FLU A&B, COVID)  RVPGX2
Influenza A by PCR: NEGATIVE
Influenza B by PCR: NEGATIVE
Resp Syncytial Virus by PCR: NEGATIVE
SARS Coronavirus 2 by RT PCR: NEGATIVE

## 2023-08-28 LAB — I-STAT CG4 LACTIC ACID, ED: Lactic Acid, Venous: 1.4 mmol/L (ref 0.5–1.9)

## 2023-08-28 LAB — BRAIN NATRIURETIC PEPTIDE: B Natriuretic Peptide: 79.3 pg/mL (ref 0.0–100.0)

## 2023-08-28 MED ORDER — ACETAMINOPHEN 325 MG PO TABS
650.0000 mg | ORAL_TABLET | Freq: Once | ORAL | Status: AC
  Administered 2023-08-28: 650 mg via ORAL
  Filled 2023-08-28: qty 2

## 2023-08-28 MED ORDER — AMOXICILLIN-POT CLAVULANATE 875-125 MG PO TABS: 1.0000 | ORAL_TABLET | Freq: Two times a day (BID) | ORAL | 0 refills | Status: AC

## 2023-08-28 MED ORDER — DOXYCYCLINE HYCLATE 100 MG PO CAPS
100.0000 mg | ORAL_CAPSULE | Freq: Two times a day (BID) | ORAL | 0 refills | Status: AC
Start: 2023-08-28 — End: ?

## 2023-08-28 MED ORDER — DOXYCYCLINE HYCLATE 100 MG PO TABS
100.0000 mg | ORAL_TABLET | Freq: Once | ORAL | Status: AC
  Administered 2023-08-28: 100 mg via ORAL
  Filled 2023-08-28: qty 1

## 2023-08-28 MED ORDER — SODIUM CHLORIDE 0.9 % IV SOLN
1.0000 g | Freq: Once | INTRAVENOUS | Status: AC
  Administered 2023-08-28: 1 g via INTRAVENOUS
  Filled 2023-08-28: qty 10

## 2023-08-28 NOTE — ED Triage Notes (Signed)
Pt arrives via EMS from home with generalized weakness, nonproductive cough for the last 2 weeks. Per EMS pt with 102.8 oral temp on arrival to patient. Given 800mg  motrin PTA. Pt reports pain in hs chest with coughing. VSS, pt awake, alert, appropriate.

## 2023-08-28 NOTE — Discharge Instructions (Addendum)
Contact a health care provider if: You have a fever. You have trouble sleeping because you cannot control your cough with cough medicine. Get help right away if: Your shortness of breath becomes worse. Your chest pain increases. Your sickness becomes worse, especially if you are an older adult or have a weak immune system. You cough up blood. These symptoms may be an emergency. Get help right away. Call 911. Do not wait to see if the symptoms will go away. Do not drive yourself to the hospital. 

## 2023-08-28 NOTE — ED Notes (Addendum)
Discharge instructions reviewed.   Newly prescribed medications discussed and pharmacy updated for accuracy.   Encouraged to return to ED with worsening sx or fevers as admission may be necessary.   Opportunity for questions and concerns provided.   Alert, oriented and escorted to familys vehicle via wheelchair. Departure vitals declined.

## 2023-08-28 NOTE — ED Notes (Signed)
Pt alert, NAD, calm, interactive, resps e/u, speaking clearly. [facility]. Endorses lethargy, fever, cough and recent PNA. Finished ABT 2d ago.

## 2023-08-28 NOTE — ED Provider Triage Note (Signed)
Emergency Medicine Provider Triage Evaluation Note  Michael Glass , a 77 y.o. male  was evaluated in triage.  Pt complains of generalized weakness.  Review of Systems  Positive: Generalized weakness, cough, fever Negative: Abdominal pain, nausea, vomiting, diarrhea  Physical Exam  There were no vitals taken for this visit. Gen:   Awake, no distress   Resp:  Normal effort, diminished bibasilar breath sounds MSK:   Moves extremities without difficulty   Medical Decision Making  Medically screening exam initiated at 3:09 PM.  Appropriate orders placed.  Michael Glass was informed that the remainder of the evaluation will be completed by another provider, this initial triage assessment does not replace that evaluation, and the importance of remaining in the ED until their evaluation is complete.  Patient presenting via EMS for generalized weakness.  He has had a nonproductive cough for the past 2 weeks.  He underwent a course of steroids which did not help.  He has had progressive generalized weakness over the past several days.  He was not aware of any fevers at home, however, EMS noted fever of 102.8 degrees.    [name], MD 08/28/23 [phone]

## 2023-08-28 NOTE — ED Provider Notes (Signed)
[place] EMERGENCY DEPARTMENT AT [facility] Provider Note   CSN: [id number] Arrival date & time: 08/28/23  1459     History  Chief Complaint  Patient presents with   Weakness   Cough    Michael Glass is a 77 y.o. male who presents to the emergency department for fever and cough.  History is given at bedside by the patient, his wife and daughter.  Patient began having a cough followed by a sore throat starting on Thanksgiving eve.  Last week he followed up at an urgent care where he was started on prednisone and given a Z-Pak and cough syrup.  Patient reports little relief in his symptoms.  He did complete a course of azithromycin.  Today his wife called the daughter stating that he was extremely lethargic.  She called 911 and when EMS arrived they found him extremely weak with a fever of 102.8.  At that time he was somewhat confused.  He was given Tylenol prior to arrival and arrives afebrile, alert and oriented.  He complains of associated fatigue, nonproductive cough and weakness.   Weakness Associated symptoms: cough   Cough      Home Medications Prior to Admission medications   Medication Sig Start Date End Date Taking? Authorizing Provider  aspirin 81 MG tablet Take 81 mg by mouth 4 (four) times daily.     [provider]  atenolol (TENORMIN) 50 MG tablet TAKE 1 TABLET DAILY 11/20/12   Rollene Rotunda, MD  fenofibrate 160 MG tablet Take 160 mg by mouth daily.      [provider]  levothyroxine (SYNTHROID, LEVOTHROID) 88 MCG tablet Take 88 mcg by mouth daily.      [provider]  Multiple Vitamins-Minerals (MEGA MULTI MEN PO) Take by mouth daily.      [provider]  nitroGLYCERIN (NITROSTAT) 0.4 MG SL tablet PLACE 1 TABLET UNDER THE TONGUE EVERY 5 MINUTES UP TO 3 DOSES AS NEEDED FOR CHEST PAIN. 03/14/13   Rollene Rotunda, MD  omeprazole (PRILOSEC) 20 MG capsule Take 20 mg by mouth daily.    [provider]   rosuvastatin (CRESTOR) 5 MG tablet Take 5 mg by mouth 2 (two) times a week.      [provider]  traZODone (DESYREL) 50 MG tablet Take 50 mg by mouth at bedtime.    [provider]  zolpidem (AMBIEN) 10 MG tablet Take 10 mg by mouth at bedtime.    [provider]      Allergies    Sulfa drugs cross reactors    Review of Systems   Review of Systems  Respiratory:  Positive for cough.   Neurological:  Positive for weakness.    Physical Exam Updated Vital Signs BP 126/68   Pulse 74   Temp 99.4 F (37.4 C) (Oral)   Ht 6' (1.829 m)   Wt 101.6 kg   SpO2 98%   BMI 30.38 kg/m  Physical Exam Vitals and nursing note reviewed.  Constitutional:      General: He is not in acute distress.    Appearance: He is well-developed. He is not diaphoretic.  HENT:     Head: Normocephalic and atraumatic.  Eyes:     General: No scleral icterus.    Conjunctiva/sclera: Conjunctivae normal.  Cardiovascular:     Rate and Rhythm: Normal rate and regular rhythm.     Heart sounds: Normal heart sounds.  Pulmonary:     Effort: Pulmonary effort is  normal. No respiratory distress.     Breath sounds: Examination of the right-upper field reveals rhonchi. Rhonchi present.     Comments: Ronchi (clears with cough) and hyperresonance RUL  Abdominal:     Palpations: Abdomen is soft.     Tenderness: There is no abdominal tenderness.  Musculoskeletal:     Cervical back: Normal range of motion and neck supple.  Skin:    General: Skin is warm and dry.  Neurological:     Mental Status: He is alert.  Psychiatric:        Behavior: Behavior normal.     ED Results / Procedures / Treatments   Labs (all labs ordered are listed, but only abnormal results are displayed) Labs Reviewed  COMPREHENSIVE METABOLIC PANEL - Abnormal; Notable for the following components:      Result Value   Sodium 129 (*)    Chloride 96 (*)    Glucose, Bld 123 (*)    Creatinine, Ser 1.62 (*)    Alkaline  Phosphatase 37 (*)    GFR, Estimated 44 (*)    All other components within normal limits  CBC WITH DIFFERENTIAL/PLATELET - Abnormal; Notable for the following components:   RBC 4.17 (*)    Hemoglobin 12.9 (*)    HCT 37.7 (*)    Abs Immature Granulocytes 0.56 (*)    All other components within normal limits  CULTURE, BLOOD (ROUTINE X 2)  CULTURE, BLOOD (ROUTINE X 2)  RESP PANEL BY RT-PCR (RSV, FLU A&B, COVID)  RVPGX2  BRAIN NATRIURETIC PEPTIDE  URINALYSIS, W/ REFLEX TO CULTURE (INFECTION SUSPECTED)  I-STAT CG4 LACTIC ACID, ED    EKG EKG Interpretation Date/Time:  Sunday August 28 2023 15:18:17 EST Ventricular Rate:  84 PR Interval:  182 QRS Duration:  72 QT Interval:  338 QTC Calculation: 399 R Axis:   53  Text Interpretation: Normal sinus rhythm Nonspecific ST and T wave abnormality Confirmed by [name] [phone]) on 08/28/2023 3:37:58 PM  Radiology DG Chest Port 1 View  Result Date: 08/28/2023 CLINICAL DATA:  Generalized weakness.  Nonproductive cough.  Fever. EXAM: PORTABLE CHEST 1 VIEW COMPARISON:  10/24/2017. 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 Cardiac silhouette is normal in size and configuration. No mediastinal or hilar masses. Clear lungs.  No pleural effusion or pneumothorax. Skeletal structures are grossly intact. Partly imaged well aligned reverse left shoulder prosthesis. IMPRESSION: No active disease. Electronically Signed   By: Amie Portland M.D.   On: 08/28/2023 15:56    Procedures Procedures    Medications Ordered in ED Medications  cefTRIAXone (ROCEPHIN) 1 g in sodium chloride 0.9 % 100 mL IVPB (has no administration in time range)  doxycycline (VIBRA-TABS) tablet 100 mg (has no administration in time range)  acetaminophen (TYLENOL) tablet 650 mg (650 mg Oral Given 08/28/23 1802)    ED Course/ Medical Decision Making/ A&P Clinical Course as of 08/28/23 2315  Sun Aug 28, 2023  1908 Creatinine(!): 1.62 [AH]  1908 I-Stat Lactic Acid, ED [AH]   1947 Patient and family comfortable with discharge at this time after discussion and shared decision making [AH]    Clinical Course User Index [AH] Arthor Captain, PA-C                                 Medical Decision Making Amount and/or Complexity of Data Reviewed Labs:  Decision-making details documented in ED Course.  Risk Prescription drug management.  Patient here with 2 weeks of cough, worsening after azithromycin. Differential diagnosis for emergent cause of cough includes but is not limited to upper respiratory infection, lower respiratory infection, allergies, asthma, irritants, foreign body, medications such as ACE inhibitors, reflux, asthma, CHF, lung cancer, interstitial lung disease, psychiatric causes, postnasal drip and postinfectious bronchospasm.  Earlier patient was febrile and at that time was lethargic and mildly confused.  Upon arrival he has had no fever here and has had no confusion in the emergency department.  Patient states that he is feeling much better after getting IV Rocephin and doxycycline.  I reviewed the patient's labs.  Normal lactic acid, normal BNP.  Urine without any evidence of infection.  CBC without elevated white blood cell count.  Mild normocytic anemia present at hemoglobin of 12.9. CMP shows mildly elevated blood glucose and creatinine of 1.62.  Last documented creatinine here below 1.3 but that was 11 years ago.  Will have him follow closely with PCP.  I visualized interpreted two-view chest x-ray which shows no acute findings.  EKG shows sinus rhythm at a rate of 84  As per ED course and shared decision making patient wants to go home.  Will broaden his treatment for community-acquired pneumonia which is a working diagnosis.  Patient will be discharged with Augmentin and doxycycline.  Strict return precautions discussed at bedside with patient and wife and daughter.        Final Clinical Impression(s) / ED Diagnoses Final  diagnoses:  None    Rx / DC Orders ED Discharge Orders     None         Arthor Captain, PA-C 08/28/23 2318    Benjiman Core, MD 08/28/23 2325

## 2023-09-02 LAB — CULTURE, BLOOD (ROUTINE X 2)
Culture: NO GROWTH
Culture: NO GROWTH
Special Requests: ADEQUATE
Special Requests: ADEQUATE
